# Patient Record
Sex: Female | Born: 1935 | Race: White | Hispanic: No | Smoking: Never smoker
Health system: Southern US, Community
[De-identification: ages and names within clinical notes are randomized; demographics above are authoritative.]

## PROBLEM LIST (undated history)

## (undated) DIAGNOSIS — K269 Duodenal ulcer, unspecified as acute or chronic, without hemorrhage or perforation: Secondary | ICD-10-CM

## (undated) DIAGNOSIS — K259 Gastric ulcer, unspecified as acute or chronic, without hemorrhage or perforation: Secondary | ICD-10-CM

## (undated) DIAGNOSIS — C801 Malignant (primary) neoplasm, unspecified: Secondary | ICD-10-CM

## (undated) DIAGNOSIS — M1990 Unspecified osteoarthritis, unspecified site: Secondary | ICD-10-CM

## (undated) HISTORY — PX: ABDOMINAL HYSTERECTOMY: SHX81

## (undated) HISTORY — PX: JOINT REPLACEMENT: SHX530

---

## 2003-12-29 ENCOUNTER — Emergency Department: Payer: Self-pay | Admitting: Emergency Medicine

## 2004-01-07 ENCOUNTER — Emergency Department: Payer: Self-pay | Admitting: General Practice

## 2004-10-20 ENCOUNTER — Ambulatory Visit: Payer: Self-pay | Admitting: Internal Medicine

## 2005-11-04 ENCOUNTER — Ambulatory Visit: Payer: Self-pay | Admitting: Internal Medicine

## 2006-12-21 ENCOUNTER — Ambulatory Visit: Payer: Self-pay | Admitting: Internal Medicine

## 2010-02-08 HISTORY — PX: CATARACT EXTRACTION, BILATERAL: SHX1313

## 2014-02-28 ENCOUNTER — Ambulatory Visit: Payer: Self-pay | Admitting: Surgery

## 2014-03-11 ENCOUNTER — Ambulatory Visit: Payer: Self-pay | Admitting: Surgery

## 2014-03-11 LAB — URINALYSIS, COMPLETE
BACTERIA: NONE SEEN
Bilirubin,UR: NEGATIVE
GLUCOSE, UR: NEGATIVE mg/dL (ref 0–75)
LEUKOCYTE ESTERASE: NEGATIVE
NITRITE: NEGATIVE
PROTEIN: NEGATIVE
Ph: 5 (ref 4.5–8.0)
RBC,UR: <1 /HPF (ref 0–5)
Specific Gravity: 1.008 (ref 1.003–1.030)
Squamous Epithelial: 1

## 2014-03-11 LAB — BASIC METABOLIC PANEL
ANION GAP: 7 (ref 7–16)
BUN: 15 mg/dL (ref 7–18)
Calcium, Total: 9 mg/dL (ref 8.5–10.1)
Chloride: 103 mmol/L (ref 98–107)
Co2: 29 mmol/L (ref 21–32)
Creatinine: 0.85 mg/dL (ref 0.60–1.30)
EGFR (African American): >60
EGFR (Non-African Amer.): >60
Glucose: 79 mg/dL (ref 65–99)
OSMOLALITY: 277 (ref 275–301)
POTASSIUM: 3.6 mmol/L (ref 3.5–5.1)
SODIUM: 139 mmol/L (ref 136–145)

## 2014-03-11 LAB — CBC
HCT: 37.3 % (ref 35.0–47.0)
HGB: 12.7 g/dL (ref 12.0–16.0)
MCH: 32.8 pg (ref 26.0–34.0)
MCHC: 33.9 g/dL (ref 32.0–36.0)
MCV: 97 fL (ref 80–100)
PLATELETS: 341 10*3/uL (ref 150–440)
RBC: 3.86 10*6/uL (ref 3.80–5.20)
RDW: 13.4 % (ref 11.5–14.5)
WBC: 7.6 10*3/uL (ref 3.6–11.0)

## 2014-03-11 LAB — APTT: Activated PTT: 32.7 secs (ref 23.6–35.9)

## 2014-03-11 LAB — PROTIME-INR
INR: 1
Prothrombin Time: 13 secs

## 2014-03-11 LAB — SEDIMENTATION RATE: ERYTHROCYTE SED RATE: 22 mm/h (ref 0–30)

## 2014-03-11 LAB — MRSA PCR SCREENING

## 2014-03-19 ENCOUNTER — Inpatient Hospital Stay: Payer: Self-pay | Admitting: Surgery

## 2014-06-03 LAB — SURGICAL PATHOLOGY

## 2014-06-09 NOTE — Op Note (Signed)
PATIENT NAME:  Brandy Orr, Brandy Orr MR#:  [id number] DATE OF BIRTH:  June 06, 1932  DATE OF PROCEDURE:  03/19/2014  PREOPERATIVE DIAGNOSIS: Advanced degenerative joint disease right hip.   POSTOPERATIVE DIAGNOSIS: Advanced degenerative joint disease right hip.   PROCEDURE: Right total hip arthroplasty using an all Press-Fit Biomet Echo system with a #13 lateral offset Echo stem, a 54 mm acetabular shell with a hi-wall liner, and a 36 mm head with a + 3 mm neck adapter.   SURGEON:  Pascal Lux, MD.   ASSISTANT: April Berndt, NP.   ANESTHESIA: Spinal.   FINDINGS: As noted above.   COMPLICATIONS: None.   ESTIMATED BLOOD LOSS: 300 mL.   TOTAL FLUIDS: 2000 mL of crystalloid.     URINE OUTPUT: 650 mL.   TOURNIQUET: None.   DRAINS: None.   CLOSURE: Staples.   BRIEF CLINICAL NOTE: The patient is an 81 year old female with a long history of progressively worsening right hip pain. Her symptoms have progressed despite medications, activity modification, et Ronney Asters. Her history and examination were consistent with advanced degenerative joint disease, confirmed by plain radiographs. 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 She presents at this time for a right total hip arthroplasty.   PROCEDURE IN DETAIL: The patient was brought into the operating room. After adequate spinal anesthesia was obtained, she was lain in the supine position and a Foley catheter inserted. She then was rolled into the left lateral decubitus position and secured using the lateral hip positioner. Appropriate padding was assured. The right hip and lower extremity were prepped with ChloraPrep solution before being draped sterilely. Preoperative antibiotics were administered.  A standard posterior approach to the hip was made through an approximately 5-6 inch incision. The incision was carried down through the subcutaneous tissues to expose the fascia overlying the abductor muscles and proximal end of the  iliotibial band. These structures were split the length of the incision and the Charnley hip retractor inserted. The bursal tissues were swept posteriorly to expose the short external rotators. The anterior border of the piriformis tendon was identified and this plane developed down through the capsule down to the joint. A flap of tissue was elevated off the posterior aspect of the greater trochanter and femoral neck and swept posteriorly. This flap included the piriformis tendon, the short external rotators, and the posterior capsule. Soft tissues were elevated off the lateral aspect of the ilium and a large Steinmann pin placed bicortically. With the right leg aligned over the left, a drill bit was placed into the greater trochanter parallel to the Steinmann pin. The distance between these two pins was measured and found to be 5.3 cm. The drill bit was removed from the greater trochanter and the hip was carefully dislocated. The piriformis fossa was debrided of soft tissues before the intramedullary canal was accessed through this point using the triple step reamer. The canal was reamed sequentially beginning with a 7 mm reamer and progressing to a 13 mm reamer. This provided good circumferential chatter. It was removed and the femoral neck cut made approximately 10-12 mm above the lesser trochanter using the extramedullary guide.   Attention was directed to the acetabular side. It was noted that much of the labrum was calcified. This was removed circumferentially including some osteophytes posteriorly and anteriorly. A line was drawn on the drapes corresponding to the native version of the acetabulum and used as a reference for reaming the acetabulum. The fovea was debrided using a large curette.  The acetabulum was reamed sequentially beginning with a 44 mm reamer, progressing to a 53 mm reamer. This provided good circumferential chatter, so the 53 mm trial acetabular insert was positioned and found to fit  well. Therefore, the 54 mm acetabular component was impacted into place with care taken to maintain the appropriate version. The trial high wall liner was inserted.   Attention was redirected to the femoral side. The box osteotome was used to establish version before the canal was broached sequentially beginning with a #9 broach and progressing to a #13 broach. This was left in place and several trial reductions performed using both a standard and lateral offset neck options, as well as the - 6 mm, the 0 mm, and the + 3 mm neck lengths. The lateral offset neck angle with the  + 3 mm neck length provided the best stability both to extension and external rotation, as well as with flexion to 90 degrees and internal rotation beyond 70 degrees. She also was stable in the position of sleep. The leg lengths were measured and found to be 5.6 cm. This slight lengthening was felt to be appropriate, given her preoperative shortening. Therefore, the trial components were removed. The manhole cover was placed at the dome of the acetabulum before the permanent liner was inserted. The locking mechanism was verified using a 1/4 inch osteotome. The femoral stem was impacted into place with care taken to maintain the appropriate version before a repeat trial reduction with a + 3 mm neck option was conducted, with the findings as described above. Therefore, the permanent 36 mm head with a + 3 mm neck was impacted into place. The Morse taper locking mechanism was verified using manual distraction and found to be excellent. The hip was relocated and again placed through a range of motion with the findings as described above.   The wound was copiously irrigated with bacitracin saline solution via the jet lavage system. In addition, the anterior, inferior, and posterior capsular tissues, as well as the peri-incisional tissues all were injected with 30 mL of 0.25% Sensorcaine with epinephrine as well as with 20 mL of Exparel diluted  out to 60 mL using normal saline. This was done to help with postoperative analgesia. The posterior flap was reapproximated through bone tunnels to the posterior aspect of the greater trochanter using #2 Tycron interrupted sutures. Several additional sutures were used to reinforce this layer of closure. The iliotibial band was reapproximated using 0 Vicryl interrupted sutures before the gluteal fascia was closed with a running number 0 Vicryl suture. The subcutaneous tissues were closed in two layers using 2-0 Vicryl interrupted sutures before the skin was closed using staples. A sterile occlusive dressing was applied to the wound before the patient was placed into an abduction wedge pillow. She was then rolled back into supine position on her hospital bed before she was awakened and returned to the recovery room in satisfactory condition after tolerating the procedure well. Of note, 1 gram of tranexamic acid in 10 mL of normal saline was injected intra-articularly after closure of the gluteal fascial layer.    ____________________________ Lenna Sciara. Dorien Chihuahua, MD jjp:bu D: 03/19/2014 14:29:35 ET T: 03/19/2014 16:23:25 ET JOB#: [id number]  cc: Pascal Lux, MD, <Dictator> Pascal Lux MD ELECTRONICALLY SIGNED 03/21/2014 11:33

## 2014-06-09 NOTE — Consult Note (Signed)
Pt hgb stable at 8.9, VSS afebrile, denies nausea or abd pain. She will not tolerate oral iron with her very large ulcer and may be a candidate for iv iron infusion at the [facility] in the next few weeks,  would also check a CBC in 2 weeks.  I will sign off.  Electronic Signatures: Manya Silvas (MD)  (Signed on 16-Feb-16 10:39)  Authored  Last Updated: 16-Feb-16 10:39 by Manya Silvas (MD)

## 2014-06-09 NOTE — Discharge Summary (Signed)
PATIENT NAME:  Brandy Orr, Brandy Orr MR#:  [id number] DATE OF BIRTH:  1932-12-04  DATE OF ADMISSION:  03/19/2014 DATE OF DISCHARGE:  03/22/2014  ADMITTING DIAGNOSIS:  Advanced degenerative arthritis of the right hip.   DISCHARGE DIAGNOSIS:  Advanced degenerative arthritis of the right hip.  OPERATION:  On 03/19/2014, the patient had a right total hip arthroplasty.   SURGEON:  Pascal Lux, MD  ASSISTANT:  April Berndt, NP   ANESTHESIA:  Spinal.   ESTIMATED BLOOD LOSS:  300 mL.   IMPLANTS USED:  Press-fit Biomet Echo system with #13 lateral offset Echo stem, 54 mm acetabular shell with high wall liner, 36 mm head and a +3 mm neck adapter. The patient was stabilized, brought to the recovery room, and then brought down to the orthopedic floor.   HISTORY AND PHYSICAL HISTORY:  The patient is a 81 year old female who presented for progressive right hip pain. The patient has been refractory to conservative treatment. The patient has done activities of daily living with difficulty and has been trying to modify this. The patient has tried injections of steroid and pain medication with no relief.   PHYSICAL EXAMINATION: GENERAL:  Well-developed, well-nourished elderly female ambulating with varus thrust to the right hip.  RESPIRATORY:  Lungs are clear to auscultation.  CARDIOVASCULAR:  Regular rate and rhythm with no murmur.  MUSCULOSKELETAL:  In regard to the right hip, the patient has discomfort at the anterior hip joint with pain with internal and external rotation. The patient has flexion to 95 degrees, internal rotation that is neutral, and external rotation of 10 degrees. The patient has neurovascular essentially that is intact.   HOSPITAL COURSE:  After initial admission on 03/19/2014, the patient was brought to the orthopedic floor. On postoperative day 1, the patient had a hemoglobin of 9.6, which dropped to 8.8 on postoperative day 2 and down to 8.4 on postoperative day 3. The patient  was doing well, initially ambulated bed to chair and progressed to ambulating 300 feet. The patient did have nausea and diarrhea on postoperative day 2, progressing into postoperative day 3 before discharge, which was controlled with medication.   CONDITION AT DISCHARGE:  Stable.   DISPOSITION:  The patient was sent to rehabilitation.   DISCHARGE INSTRUCTIONS:  The patient will follow up at [facility] in 2 weeks. The patient will do weight bear as tolerated on the affected leg. The patient is not to cross her legs. She will not reach below her knees and not twist. The patient will use knee-high TED hose on both legs to be removed 1 hour every 8-hour shift. The patient will use an incentive spirometer and be encouraged to do cough and deep breathing. The patient will do a regular diet. The patient will not get her dressing wet and try to keep it clean. The patient will call the clinic if there is any bright red bleeding, calf pain, any bowel or bladder difficulty, or any fever greater than 101.5. The patient will do physical therapy and occupational therapy per protocol.   DISCHARGE MEDICATIONS:  Zanaflex 2 mg 2 times a day for spasms, tramadol 50 mg 1 tablet every 8 hours as needed for pain, Cosamin DS 400/500 mg 1 capsule daily, Tylenol 500 mg 1 tablet q. 4 hours, oxycodone 5 mg 1 tablet q. 4 hours, Milk of Magnesia 30 mL 2 times a day, aluminum hydroxide 30 mL q. 6 hours as needed, hydroxide simethicone for indigestion, Lovenox 40 mg injectable once a  day for 14 days and then discontinue and start aspirin 81 mg once a day, bisacodyl 10 mg rectally p.r.n. for constipation, Senokot-S 1 tablet b.i.d.   ____________________________ J. Reche Dixon, [place] jtm:nb D: 03/22/2014 06:39:18 ET T: 03/22/2014 06:48:55 ET JOB#: [id number]  cc: J. Reche Dixon, [place], <Dictator> J [facility]son [facility] ELECTRONICALLY SIGNED 03/28/2014 15:50

## 2014-06-09 NOTE — Consult Note (Signed)
Pt with recent Right hip replacement who had hematemesis and dropped hgb over last 24 hours.  The first vomitus looked like food she had eaten and the second looked like blood and the third was thin liquid.  She was on low dose lovenox which has been stopped.  Due to the need for anticoagulation after the hip replacement I will go ahead with EGD tomorrow to see if dealing with Mack Guise tear or ulcer or gastritis.  Will give Zosyn for prevention of possible blood bourne infection to new hip.  Electronic Signatures: Manya Silvas (MD)  (Signed on 12-Feb-16 17:00)  Authored  Last Updated: 12-Feb-16 17:00 by Manya Silvas (MD)

## 2014-06-09 NOTE — Consult Note (Signed)
Pt hgb 9, VSS afebrile, no vomiting, no abd pain, "I feel better". Pt diet advanced to full liquids.  She should stay at this diet for 3 more days with Ensure or Boost supplements also.  She needs to stay on Prilosec or equivalant medication forever, for bid therapy for 3 months then daily.  May be discharged to rehab tomorrow.  Electronic Signatures: Manya Silvas (MD)  (Signed on 15-Feb-16 17:35)  Authored  Last Updated: 15-Feb-16 17:35 by Manya Silvas (MD)

## 2014-06-09 NOTE — Op Note (Signed)
PATIENT NAME:  Brandy Orr, Brandy Orr MR#:  [id number] DATE OF BIRTH:  1932/09/24  DATE OF PROCEDURE:  03/23/2014  PREOPERATIVE DIAGNOSES:  1.  Massive duodenal ulcer.  2.  Postoperative day 4, status post right hip replacement.  3.  High risk for deep vein thrombosis and therefore pulmonary embolism.  POSTOPERATIVE DIAGNOSIS:    1.  Massive duodenal ulcer.  2.  Postoperative day 4, status post right hip replacement.  3.  High risk for deep vein thrombosis and therefore pulmonary embolism.  PROCEDURE PERFORMED:  1.  Inferior vena cavogram.  2.  Placement of infrarenal inferior vena cava filter, Cook-Celect type.   SURGEON: Katha Cabal, MD   SEDATION:  Versed 3 mg.   ACCESS: Right common femoral vein.   FLUOROSCOPY TIME: 0.7 minutes.   CONTRAST USED: Isovue 15 mL.   INDICATIONS: Ms. Corona is an 81 year old woman who is 4 days status post hip replacement and now found to have a massive duodenal ulcer. She is felt to be at high risk for hemorrhage and therefore is not a candidate for anticoagulation, even for prophylaxis.  I have therefore been requested to place a filter to prevent lethal pulmonary embolism in a high risk patient.   DESCRIPTION OF PROCEDURE: The patient is taken to special procedures and placed in the supine position.  After adequate sedation is achieved, she is positioned supine and the right groin is prepped and draped in a sterile fashion. Ultrasound is placed in a sterile sleeve.  The femoral vein is noted.  It is rather small but compressible, indicating patency. Images recorded for the permanent record. 1% lidocaine is infiltrated and a micropuncture needle is inserted, microwire followed by micro sheath, J-wire followed by the delivery sheath.  A bolus injection of contrast is used to demonstrate the inferior vena cava.  After review of the images, the wire is reintroduced and the filter is advanced to the level of L2.  The filter is then deployed without  difficulty. The sheath is pulled, pressure held. There are no immediate complications.   INTERPRETATION: The inferior vena cava is opacified with a bolus injection of contrast. It is somewhat large at the level of the renals; however, measurement demonstrates that at the level of the renals, it is approximately 30 mm at the level of L2.  The vena cava is measured and noted to be 24 mm adequate for placement of a filter, therefore the Celect filter is deployed at the level of L2 in good orientation.   SUMMARY: Successful placement of infrarenal inferior vena cava filter.     ____________________________ Katha Cabal, MD ggs:DT D: 03/23/2014 15:32:39 ET T: 03/23/2014 16:44:56 ET JOB#: [id number]  cc: Katha Cabal, MD, <Dictator> Katha Cabal MD ELECTRONICALLY SIGNED 04/16/2014 14:24

## 2014-06-09 NOTE — Consult Note (Signed)
 General Aspect massive duodenal ulcer in a patient pod 4 from right hip   Present Illness The patient is an 82-year-old who came to [facility] on 03/19/2014 for a  right total hip arthroplasty posterior approach. On postoperative day 1, she was noted to have a hemoglobin of 9.6. Follow up hemoglobin was 8.9. She was also hypotensive with blood pressure in the 80s over 40s, her hemoglobin drifted to 8.4 yesterday. She had dry heaves following coffee-ground emesis. No stomach issues prior to this admission. Today she had an EGD which shows a massive duodenal ulcer.  Given her history of declining Hgb and her huge ulcer it is felt she can not have any anticoagulation which is indicated given her high risk for DVT adn PE following hip surgery.  PAST MEDICAL HISTORY: Arthritis.   PAST SURGICAL HISTORY:   1. Hysterectomy.  2. Tonsillectomy.  3. Cataract surgery.   Home Medications: Medication Instructions Status  oxyCODONE 5 mg oral tablet 1 tab(s) orally every 4 hours, As Needed, severe pain (7-10/10) Active  traMADol 50 mg oral tablet 1 tab(s) orally every 8 hours, As Needed - for Pain Active  docusate-senna 50 mg-8.6 mg oral tablet 1 tab(s) orally 2 times a day prn for constipation Active  Lovenox 40 mg/0.4 mL injectable solution  injectable once a day x 14 days, then discontinue and begin ASA 81 mg once a day. Active  bisacodyl 10 mg rectal suppository 1 suppository(ies) rectal once a day, As needed, constipation if no results with MOM Active  acetaminophen 500 mg oral tablet 1 tab(s) orally every 4 hours, As needed, mild pain (1-3/10) or fever Active  magnesium hydroxide 8% oral suspension 30 milliliter(s) orally 2 times a day, As needed, constipation Active  aluminum hydroxide/magnesium hydroxide/simethicone 400 mg-400 mg-40 mg/5 mL oral suspension 30 milliliter(s) orally every 6 hours, As needed, indigestion, heartburn Active  Cosamin DS 400 mg-500 mg oral capsule 1 cap(s) orally once a day (at  bedtime) Active  tiZANidine 2 mg oral capsule 2 cap(s) orally 2 times a day, As Needed for muscle spams. Active    Hydrocodone: N/V/Diarrhea  Case History:  Family History Non-Contributory   Social History negative tobacco, negative ETOH, negative Illicit drugs   Review of Systems:  ROS No TIA/stroke/seizure No heat or cold intolerance No dysuria/hematuria No blurry or double vision No tinnitus or ear pain No rashes or ulcer No suicidal ideation or psychosis No signs of bleeding or easy bruising No SOB/DOE, orthopnea, or sputum No palpitations or chest pain No N/V/D or abdominal pain No joint pain or joint swelling No fever or chills No unintentional weight loss or gain   Physical Exam:  GEN well developed, well nourished, no acute distress   HEENT hearing intact to voice, moist oral mucosa   NECK supple  trachea midline   RESP normal resp effort  no use of accessory muscles   CARD regular rate  no JVD   ABD denies tenderness  soft   EXTR negative cyanosis/clubbing, negative edema   SKIN No rashes, No ulcers, skin turgor decreased   NEURO cranial nerves intact, follows commands   PSYCH alert, A+O to time, place, person   Nursing/Ancillary Notes: **Vital Signs.:   13-Feb-16 09:58  Vital Signs Type Post-Procedure  Temperature Temperature (F) 99.1  Celsius 37.2  Temperature Source oral  Pulse Pulse 111  Respirations Respirations 18  Systolic BP Systolic BP 121  Diastolic BP (mmHg) Diastolic BP (mmHg) 63  Mean BP 82  Pulse   Ox % Pulse Ox % 97  Pulse Ox Activity Level  At rest  Oxygen Delivery Room Air/ 21 %   Routine Micro:  12-Feb-16 10:42   Micro Text Report CLOSTRIDIUM DIFFICILE   C.DIFFICILE ANTIGEN       C.DIFFICILE GDH ANTIGEN : NEGATIVE   C.DIFFICILE TOXIN A/B     C.DIFFICILE TOXINS A AND B : NEGATIVE   INTERPRETATION            Negative for C. difficile.    ANTIBIOTIC                        Routine Chem:  12-Feb-16 04:13   Glucose, Serum   100  BUN  27  Creatinine (comp) 0.85  Sodium, Serum 138  Potassium, Serum 3.5  Chloride, Serum 104  CO2, Serum 29  Calcium (Total), Serum  8.2  Anion Gap  5  Osmolality (calc) 281  eGFR (African American) >60  eGFR (Non-African American) >60 (eGFR values <60mL/min/1.73 m2 may be an indication of chronic kidney disease (CKD). Calculated eGFR, using the MRDR Study equation, is useful in  patients with stable renal function. The eGFR calculation will not be reliable in acutely ill patients when serum creatinine is changing rapidly. It is not useful in patients on dialysis. The eGFR calculation may not be applicable to patients at the low and high extremes of body sizes, pregnant women, and vegetarians.)  Routine Hem:  12-Feb-16 04:13   WBC (CBC) 9.7  RBC (CBC)  2.61  Hemoglobin (CBC)  8.4  Hematocrit (CBC)  25.4  Platelet Count (CBC) 268  MCV 97  MCH 32.4  MCHC 33.3  RDW 13.2  Neutrophil % 79.1  Lymphocyte % 8.3  Monocyte % 10.7  Eosinophil % 1.4  Basophil % 0.5  Neutrophil #  7.6  Lymphocyte #  0.8  Monocyte #  1.0  Eosinophil # 0.1  Basophil # 0.0 (Result(s) reported on 22 Mar 2014 at 06:07AM.)    Impression 1.  Massive Duodenal ulcer with declining Hgb  given these findings I have recommended IVC filter as she is not a candidate for routine DVT prophylaxis           risks and benefits were discussed and she agrees to proceed 2.  PUD GI on consult  continue PPI 3.  Right hip plan per ortho 4.  DJD  will need to hold NSAID's   Plan level 3 consult   Electronic Signatures: Schnier, Gregory (MD)  (Signed 13-Feb-16 21:10)  Authored: General Aspect/Present Illness, Home Medications, Allergies, History and Physical Exam, Vital Signs, Labs, Impression/Plan   Last Updated: 13-Feb-16 21:10 by Schnier, Gregory (MD) 

## 2014-06-09 NOTE — Discharge Summary (Signed)
PATIENT NAME:  Brandy Orr, Brandy Orr MR#:  [id number] DATE OF BIRTH:  [birth year]  DATE OF ADMISSION:  03/19/2014 DATE OF DISCHARGE:  03/26/2014  ADMITTING DIAGNOSIS: Degenerative arthritis, right hip.  DISCHARGE DIAGNOSES: Degenerative arthritis, right hip.  SURGERY: On 03/19/2014, she had a right total hip arthroplasty, posterior approach.   SURGEON: Elenore Rota, MD  ANESTHESIA: Spinal.   ESTIMATED BLOOD LOSS: 300 mL.  DRAINS: None.  IMPLANTS: Biomet Echo system, #13 lateral offset stem, 54 mm acetabular shell with high wall liner and 36 mm head with +3 mm neck.   COMPLICATIONS: None during surgery. Posteroperative period complicated by GI bleed from duodenal ulcer.  HOSPITAL COURSE: After initial admission on 03/19/2014, she underwent surgery the same day. She had good pain control afterwards and was transported to the orthopedic floor from PACU. On postoperative day one, 03/20/2014, hemoglobin 9.6, hematocrit 29.1. On room air. Physical therapy was begun on that day, with slow progress. She had some dizziness, and hemoglobin was repeated. It was 8.9 and hematocrit 27.3. She was also hypotensive, 80s/40s, and this was thought to be from her pain medicine. She was given a bolus of 500 mL IV fluids. On postoperative day two, 03/21/2014, normotensive. Hemoglobin 8.8. Hematocrit 26.6. She continued with physical therapy, with slow progress. On postoperative day 3, she  had a bowel movement, but began vomiting coffee ground emesis. GI was consulted. On POD #4, upper GI endoscopy performed, 3cm duodenal ulcer present, not actively bleeding. Protonix started. Lovenox stopped. Vascular surgery consulted. IVC filter placed due to high risk of DVT after total hip arthoplasty. On POD #5, stable, progressing slow with PT, hemoglobin 7.0. One unit PRBC transfused, repeat hemoglobin 8.9 after transfusion. POD #6, hemoglobin 9.0. POD #7, hemoglobin 8.9, cleared by GI for discharge, stable.    CONDITION AT  DISCHARGE: Stable.   DISPOSITION: The patient was sent to rehab.   DISCHARGE INSTRUCTIONS: Weight-bearing as tolerated, left lower extremity. Posterior hip precautions. She will continue physical therapy at rehab. Liquid diet with supplements x 2 days, then mechanical soft x 2 days, then regular diet as tolerated.  Knee-high TED hose bilaterally. She will follow up in [place] in 2 weeks for staple removal.   DISCHARGE MEDICATIONS: Please see discharge instructions for complete list of discharge medications.   ____________________________ Bueford Arp M. Tretha Sciara, NP amb:sb D: 03/22/2014 06:51:49 ET T: 03/22/2014 07:08:48 ET JOB#: [id number]  cc: Marvelous Bouwens M. Tretha Sciara, NP, <Dictator> Kem Kays Ewart Carrera FNP ELECTRONICALLY SIGNED 03/26/2014 10:44

## 2014-06-09 NOTE — Consult Note (Signed)
Pt with a very large ulcer of the duodenal bulb covering 60-75% of the bulb.  No active bleeding, no visible vessel, the passage into the distal duodenum is open.  She will need to be balanced between anticoagulation for prevention of thromboembolic events and bleed from this ulcer.  Vance Peper PA in ortho notified of findings.  Will recommend clear liq diet and advance to full liquids with Boost or Ensure supplement and hematology consult.  Electronic Signatures: Manya Silvas (MD)  (Signed on 13-Feb-16 09:32)  Authored  Last Updated: 13-Feb-16 09:32 by Manya Silvas (MD)

## 2014-06-09 NOTE — Consult Note (Signed)
PATIENT NAME:  Brandy Orr, Brandy Orr MR#:  [id number] DATE OF BIRTH:  03-04-1932  DATE OF CONSULTATION:  03/22/2014  REFERRING PHYSICIAN:  Pascal Lux, MD  CONSULTING PHYSICIAN:  Janalyn Harder. Jerelene Redden, ANP (Adult Nurse Practitioner)  REASON FOR CONSULTATION: Coffee-ground emesis.   HISTORY OF PRESENT ILLNESS: This 81 year old patient reports a good health with exception of arthritis. She has been taking Aleve 2 a day for an undetermined amount of time for chronic hip pain. The patient did come into the hospital on 03/19/2014 for a  right total hip arthroplasty posterior approach. Her estimated blood loss was 300 mL. She had good pain control afterward, and on postoperative day 1, she was noted to have a hemoglobin of 9.6. Physical therapy was started with slow progress, and she had some dizziness, with repeat hemoglobin checked at 8.9. She was also hypotensive with blood pressure in the 80s over 40s, initially thought secondary from pain medication. She did receive one 500 mL bolus of fluid. Yesterday, she was normotensive, hemoglobin was 8.8 and had drifted to 8.4 today. She did have a suppository yesterday afternoon and passed a normal bowel movement. She then had loose stools and was given Senokot last evening and developed vomiting 3 times, with diarrhea during the night. She had passed some watery greenish stool. There was no abdominal pain. She had dry heaves following coffee-ground emesis. No stomach issues prior to this admission. She is currently chewing on ice chips without distress. She denies history of EGD, prior colonoscopy, or problems with heartburn or indigestion. GI has been asked to see her for further evaluation and management. The patient was slated to be released tomorrow to go to [facility] for rehabilitation.   PAST MEDICAL HISTORY: Arthritis.   PAST SURGICAL HISTORY:   1. Hysterectomy.  2. Tonsillectomy.  3. Cataract surgery.   MEDICATIONS AT HOME: 1. Tramadol.  2.  Flexeril.  3. Aleve 2 tablets daily.  4. Aspirin for 6 years.   ALLERGIES: HYDROCODONE.   SOCIAL HISTORY: Habits: Negative tobacco or alcohol.   FAMILY HISTORY: Negative for colon cancer.   REVIEW OF SYSTEMS: Ten systems reviewed. Pertinent positives noted in the history of present illness, otherwise negative. Diarrhea has resolved with negative Clostridium difficile.   LABORATORY DATA: Daughter reports her baseline hemoglobin is in the 12 range. The patient's initial day 1 postoperative hemoglobin 9.6, currently 8.4, with BUN up at 27, with creatinine 0.85.   IMPRESSION:  1. The patient presents for a right hip replacement and developed gradually worsening anemia with coffee-ground emesis. This is likely an upper gastrointestinal bleed, could be secondary to gastritis, but we do need to make sure she does not have an ulcer that needs to be clipped. Therefore, an upper endoscopy is planned for tomorrow morning. She will need to have a pillow between her legs when she turns to her side to take care not to cross her legs over one another. She will also need to have IV Zosyn 3.375 grams in the endoscopy suite before the upper endoscopy for the fresh hip replacement.  2. Repeat the hemoglobin in the morning.  3. Continue with IV Protonix twice daily.  4. She is n.p.o. at this time and will need to be n.p.o. after midnight.  5. She will likely need to be in the hospital for another day or 2 to monitor.  6. This case was discussed with Dr. Vira Agar in collaboration of care.  7. Follow up her study results.   Thank you  for this consultation.   These services provided by Royetta Crochet. Jerelene Redden, MS, APRN, [facility], APN under collaborative agreement with Manya Silvas, MD    ____________________________ Janalyn Harder Jerelene Redden, ANP (Adult Nurse Practitioner) kam:mw D: 03/22/2014 16:43:09 ET T: 03/22/2014 17:14:29 ET JOB#: [id number]  cc: Joelene Millin A. Jerelene Redden, ANP (Adult Nurse Practitioner), <Dictator> Janalyn Harder Sherlyn Hay, MSN, ANP-BC Adult Nurse Practitioner ELECTRONICALLY SIGNED 03/26/2014 13:26

## 2014-07-12 ENCOUNTER — Ambulatory Visit: Payer: Medicare Other | Admitting: Anesthesiology

## 2014-07-12 ENCOUNTER — Ambulatory Visit
Admission: RE | Admit: 2014-07-12 | Discharge: 2014-07-12 | Disposition: A | Discharge status: Home / Self Care | Payer: Medicare Other | Source: Ambulatory Visit | Attending: Unknown Physician Specialty | Admitting: Unknown Physician Specialty

## 2014-07-12 ENCOUNTER — Encounter: Payer: Self-pay | Admitting: *Deleted

## 2014-07-12 ENCOUNTER — Encounter
Admission: RE | Disposition: A | Discharge status: Home / Self Care | Payer: Self-pay | Source: Ambulatory Visit | Attending: Unknown Physician Specialty

## 2014-07-12 DIAGNOSIS — K2970 Gastritis, unspecified, without bleeding: Secondary | ICD-10-CM | POA: Diagnosis not present

## 2014-07-12 DIAGNOSIS — K2950 Unspecified chronic gastritis without bleeding: Secondary | ICD-10-CM | POA: Insufficient documentation

## 2014-07-12 DIAGNOSIS — Z79899 Other long term (current) drug therapy: Secondary | ICD-10-CM | POA: Diagnosis not present

## 2014-07-12 DIAGNOSIS — K259 Gastric ulcer, unspecified as acute or chronic, without hemorrhage or perforation: Secondary | ICD-10-CM | POA: Diagnosis present

## 2014-07-12 DIAGNOSIS — M1990 Unspecified osteoarthritis, unspecified site: Secondary | ICD-10-CM | POA: Diagnosis not present

## 2014-07-12 DIAGNOSIS — K264 Chronic or unspecified duodenal ulcer with hemorrhage: Secondary | ICD-10-CM | POA: Diagnosis not present

## 2014-07-12 DIAGNOSIS — K210 Gastro-esophageal reflux disease with esophagitis: Secondary | ICD-10-CM | POA: Diagnosis not present

## 2014-07-12 HISTORY — PX: ESOPHAGOGASTRODUODENOSCOPY (EGD) WITH PROPOFOL: SHX5813

## 2014-07-12 HISTORY — DX: Unspecified osteoarthritis, unspecified site: M19.90

## 2014-07-12 SURGERY — ESOPHAGOGASTRODUODENOSCOPY (EGD) WITH PROPOFOL
Anesthesia: General

## 2014-07-12 MED ORDER — FENTANYL CITRATE (PF) 100 MCG/2ML IJ SOLN
INTRAMUSCULAR | Status: DC | PRN
  Administered 2014-07-12: 50 ug via INTRAVENOUS

## 2014-07-12 MED ORDER — MIDAZOLAM HCL 2 MG/2ML IJ SOLN
INTRAMUSCULAR | Status: DC | PRN
  Administered 2014-07-12: 1 mg via INTRAVENOUS

## 2014-07-12 MED ORDER — SODIUM CHLORIDE 0.9 % IV SOLN
INTRAVENOUS | Status: DC
  Administered 2014-07-12: 1000 mL via INTRAVENOUS

## 2014-07-12 MED ORDER — PIPERACILLIN-TAZOBACTAM 3.375 G IVPB 30 MIN
3.3750 g | Freq: Once | INTRAVENOUS | Status: AC
  Administered 2014-07-12: 3.375 g via INTRAVENOUS
  Filled 2014-07-12: qty 50

## 2014-07-12 MED ORDER — PROPOFOL 10 MG/ML IV BOLUS
INTRAVENOUS | Status: DC | PRN
  Administered 2014-07-12: 40 mg via INTRAVENOUS
  Administered 2014-07-12: 25 mg via INTRAVENOUS

## 2014-07-12 MED ORDER — LIDOCAINE HCL (CARDIAC) 20 MG/ML IV SOLN
INTRAVENOUS | Status: DC | PRN
  Administered 2014-07-12: 100 mg via INTRAVENOUS

## 2014-07-12 MED ORDER — GLYCOPYRROLATE 0.2 MG/ML IJ SOLN
INTRAMUSCULAR | Status: DC | PRN
  Administered 2014-07-12: 0.2 mg via INTRAVENOUS

## 2014-07-12 NOTE — Anesthesia Postprocedure Evaluation (Signed)
  Anesthesia Post-op Note  Patient: Brandy Orr  Procedure(s) Performed: Procedure(s): ESOPHAGOGASTRODUODENOSCOPY (EGD) WITH PROPOFOL (N/A)  Anesthesia type:General  Patient location: PACU  Post pain: Pain level controlled  Post assessment: Post-op Vital signs reviewed, Patient's Cardiovascular Status Stable, Respiratory Function Stable, Patent Airway and No signs of Nausea or vomiting  Post vital signs: Reviewed and stable  Last Vitals:  Filed Vitals:   07/12/14 1406  BP: 108/62  Pulse: 96  Temp:   Resp: 15    Level of consciousness: awake, alert  and patient cooperative  Complications: No apparent anesthesia complications

## 2014-07-12 NOTE — Anesthesia Preprocedure Evaluation (Addendum)
Anesthesia Evaluation  Patient identified by MRN, date of birth, ID band Patient awake    Reviewed: Allergy & Precautions, NPO status   History of Anesthesia Complications Negative for: history of anesthetic complications  Airway Mallampati: II       Dental  (+) Lower Dentures, Upper Dentures   Pulmonary neg pulmonary ROS,    Pulmonary exam normal       Cardiovascular negative cardio ROS  Rhythm:Regular Rate:Normal     Neuro/Psych Anxiety negative neurological ROS     GI/Hepatic negative GI ROS, Neg liver ROS,   Endo/Other  negative endocrine ROS  Renal/GU negative Renal ROS  negative genitourinary   Musculoskeletal  (+) Arthritis -, Osteoarthritis,    Abdominal Normal abdominal exam  (+)   Peds negative pediatric ROS (+)  Hematology negative hematology ROS (+)   Anesthesia Other Findings   Reproductive/Obstetrics negative OB ROS                             Anesthesia Physical Anesthesia Plan  ASA: II  Anesthesia Plan: General   Post-op Pain Management:    Induction: Intravenous  Airway Management Planned: Nasal Cannula  Additional Equipment:   Intra-op Plan:   Post-operative Plan:   Informed Consent:   Plan Discussed with: CRNA  Anesthesia Plan Comments:         Anesthesia Quick Evaluation

## 2014-07-12 NOTE — Op Note (Signed)
[facility] Patient Name: Brandy Orr Procedure Date: 07/12/2014 1:35 PM MRN: [id number] Account #: [id number] Date of Birth: 19-Sep-1932 Admit Type: Outpatient Age: 81 Room: [facility] ENDO ROOM 4 Gender: Female Note Status: Finalized Procedure:         Upper GI endoscopy Indications:       Follow-up of acute duodenal ulcer with hemorrhage Providers:         Manya Silvas, MD Referring MD:      Glendon Axe (Referring MD) Medicines:         Propofol per Anesthesia Complications:     No immediate complications. Procedure:         Pre-Anesthesia Assessment:                    - After reviewing the risks and benefits, the patient was                     deemed in satisfactory condition to undergo the procedure.                    After obtaining informed consent, the endoscope was passed                     under direct vision. Throughout the procedure, the                     patient's blood pressure, pulse, and oxygen saturations                     were monitored continuously. The Endoscope was introduced                     through the mouth, and advanced to the second part of                     duodenum. The upper GI endoscopy was accomplished without                     difficulty. The patient tolerated the procedure well. Findings:      LA Grade A (one or more mucosal breaks less than 5 mm, not extending       between tops of 2 mucosal folds) esophagitis with no bleeding was found       39 cm from the incisors.      Diffuse and localized mild inflammation characterized by erythema and       granularity was found in the gastric body. Biopsies were taken with a       cold forceps for histology. Biopsies were taken with a cold forceps for       Helicobacter pylori testing.      One non-bleeding cratered gastric ulcer with no stigmata of bleeding was       found at the pylorus. The lesion was 3 mm in largest dimension. Biopsies       were  taken with a cold forceps for histology. Biopsies were taken with a       cold forceps for Helicobacter pylori testing. Bx taken of surrounding       inflammation.      The examined duodenum was normal. Impression:        - LA Grade A reflux esophagitis.                    - Gastritis.  Biopsied.                    - Gastric ulcer with clean base. Biopsied.                    - Normal examined duodenum. Recommendation:    - Await pathology results. Continue stomach medicine for                     ever. Manya Silvas, MD 07/12/2014 1:53:32 PM This report has been signed electronically. Number of Addenda: 0 Note Initiated On: 07/12/2014 1:35 PM      [facility]

## 2014-07-12 NOTE — Transfer of Care (Signed)
Immediate Anesthesia Transfer of Care Note  Patient: Brandy Orr  Procedure(s) Performed: Procedure(s): ESOPHAGOGASTRODUODENOSCOPY (EGD) WITH PROPOFOL (N/A)  Patient Location: PACU  Anesthesia Type:General  Level of Consciousness: awake and alert   Airway & Oxygen Therapy: Patient Spontanous Breathing  Post-op Assessment: Report given to RN  Post vital signs: Reviewed and stable  Last Vitals:  Filed Vitals:   07/12/14 1300  BP: 128/69  Pulse: 77  Temp: 35.8 C  Resp: 20    Complications: No apparent anesthesia complications

## 2014-07-12 NOTE — H&P (Signed)
   Gaylyn Cheers, MD    Primary Care Physician:  Glendon Axe, MD Primary Gastroenterologist:  Dr. Vira Agar  Pre-Procedure History & Physical: HPI:  Brandy Orr is a 81 y.o. female is here for an endoscopy. 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   Past Medical History  Diagnosis Date  . Arthritis     Past Surgical History  Procedure Laterality Date  . Joint replacement    . Abdominal hysterectomy      Prior to Admission medications   Medication Sig Start Date End Date Taking? Authorizing Provider  acetaminophen (TYLENOL) 650 MG CR tablet Take 1,300 mg by mouth every 8 (eight) hours as needed for pain.   Yes Historical Provider, MD  COSAMIN DS 500-400 MG CAPS Take 1 tablet by mouth 2 (two) times daily. 04/12/14  Yes Historical Provider, MD  docusate sodium (COLACE) 100 MG capsule Take 100 mg by mouth daily as needed for mild constipation.   Yes Historical Provider, MD  pantoprazole (PROTONIX) 40 MG tablet Take 1 tablet by mouth daily. 06/11/14  Yes Historical Provider, MD    Allergies as of 06/14/2014  . (Not on File)    History reviewed. No pertinent family history.    Review of Systems: See HPI, otherwise negative ROS  Physical Exam: BP 128/69 mmHg  Pulse 77  Temp(Src) 96.5 F (35.8 C) (Tympanic)  Resp 20  Ht 5\' 7"  (1.702 m)  Wt 68.04 kg (150 lb)  BMI 23.49 kg/m2 General:   Alert,  pleasant and cooperative in NAD Head:  Normocephalic and atraumatic. Neck:  Supple; no masses or thyromegaly. Lungs:  Clear throughout to auscultation.    Heart:  Regular rate and rhythm. Abdomen:  Soft, nontender and nondistended. Normal bowel sounds, without guarding, and without rebound.   Neurologic:  Alert and  oriented x4;  grossly normal neurologically.  Impression/Plan: Brandy Orr is here for an endoscopy to be performed for follow up of giant duodenal ulcer.  Risks, benefits, limitations, and alternatives regarding  endoscopy have  been reviewed with the patient.  Questions have been answered.  All parties agreeable.   Gaylyn Cheers, MD  07/12/2014, 1:34 PM

## 2014-07-16 LAB — SURGICAL PATHOLOGY

## 2014-07-17 ENCOUNTER — Encounter: Payer: Self-pay | Admitting: Unknown Physician Specialty

## 2014-07-24 ENCOUNTER — Encounter: Payer: Self-pay | Admitting: *Deleted

## 2014-07-24 ENCOUNTER — Ambulatory Visit
Admission: RE | Admit: 2014-07-24 | Discharge: 2014-07-24 | Disposition: A | Discharge status: Home / Self Care | Payer: Medicare Other | Source: Ambulatory Visit | Attending: Vascular Surgery | Admitting: Vascular Surgery

## 2014-07-24 ENCOUNTER — Encounter
Admission: RE | Disposition: A | Discharge status: Home / Self Care | Payer: Self-pay | Source: Ambulatory Visit | Attending: Vascular Surgery

## 2014-07-24 DIAGNOSIS — E785 Hyperlipidemia, unspecified: Secondary | ICD-10-CM | POA: Insufficient documentation

## 2014-07-24 DIAGNOSIS — M1610 Unilateral primary osteoarthritis, unspecified hip: Secondary | ICD-10-CM | POA: Diagnosis not present

## 2014-07-24 DIAGNOSIS — Z86718 Personal history of other venous thrombosis and embolism: Secondary | ICD-10-CM | POA: Diagnosis not present

## 2014-07-24 HISTORY — PX: PERIPHERAL VASCULAR CATHETERIZATION: SHX172C

## 2014-07-24 SURGERY — IVC FILTER REMOVAL
Anesthesia: Moderate Sedation

## 2014-07-24 MED ORDER — IOHEXOL 300 MG/ML  SOLN
INTRAMUSCULAR | Status: DC | PRN
  Administered 2014-07-24: 15 mL via INTRAVENOUS

## 2014-07-24 MED ORDER — FENTANYL CITRATE (PF) 100 MCG/2ML IJ SOLN
INTRAMUSCULAR | Status: DC | PRN
  Administered 2014-07-24 (×3): 50 ug via INTRAVENOUS

## 2014-07-24 MED ORDER — MIDAZOLAM HCL 2 MG/2ML IJ SOLN
INTRAMUSCULAR | Status: AC
  Filled 2014-07-24: qty 4

## 2014-07-24 MED ORDER — SODIUM CHLORIDE 0.9 % IV SOLN
INTRAVENOUS | Status: DC
  Administered 2014-07-24: 14:00:00 via INTRAVENOUS

## 2014-07-24 MED ORDER — HEPARIN (PORCINE) IN NACL 2-0.9 UNIT/ML-% IJ SOLN
INTRAMUSCULAR | Status: AC
  Filled 2014-07-24: qty 500

## 2014-07-24 MED ORDER — FENTANYL CITRATE (PF) 100 MCG/2ML IJ SOLN
INTRAMUSCULAR | Status: AC
  Filled 2014-07-24: qty 2

## 2014-07-24 MED ORDER — FENTANYL CITRATE (PF) 100 MCG/2ML IJ SOLN
INTRAMUSCULAR | Status: AC
Start: 2014-07-24 — End: 2014-07-24
  Filled 2014-07-24: qty 2

## 2014-07-24 MED ORDER — MIDAZOLAM HCL 2 MG/2ML IJ SOLN
INTRAMUSCULAR | Status: DC | PRN
  Administered 2014-07-24: 1 mg via INTRAVENOUS
  Administered 2014-07-24: 2 mg via INTRAVENOUS
  Administered 2014-07-24: 1 mg via INTRAVENOUS

## 2014-07-24 MED ORDER — LIDOCAINE-EPINEPHRINE (PF) 1 %-1:200000 IJ SOLN
INTRAMUSCULAR | Status: AC
  Filled 2014-07-24: qty 30

## 2014-07-24 SURGICAL SUPPLY — 5 items
PACK ANGIOGRAPHY (CUSTOM PROCEDURE TRAY) ×3 IMPLANT
SET VENACAVA FILTER RETRIEVAL (MISCELLANEOUS) ×3 IMPLANT
SHEATH 12FRX45 (SHEATH) ×3 IMPLANT
TOWEL OR 17X26 4PK STRL BLUE (TOWEL DISPOSABLE) ×3 IMPLANT
WIRE J 3MM .035X145CM (WIRE) ×3 IMPLANT

## 2014-07-24 NOTE — Discharge Instructions (Signed)
Limit your activity for the next two days after your procedure.  Avoid stooping, bending, heavy lifting or exertion as this may put pressure on the insertion site.  Resume normal activities in 48 hours.  You may shower after 24 hours but avoid excessive warm water and do not scrub the site.  Remove clear dressing in 48 hours.  If you have had a closure device inserted, do not soak in a tub bath or a hot tub for at least one week.  No driving for 48 hours after discharge.  After the procedure, check the insertion site occasionally.  If any oozing occurs or there is apparent swelling, firm pressure over the site will prevent a bruise from forming.  You can not hurt anything by pressing directly on the site.  The pressure stops the bleeding by allowing a small clot to form.  If the bleeding continues after the pressure has been applied for more than 15 minutes, call 911 or go to the nearest emergency room.    The x-ray dye causes you to pass a considerate amount of urine.  For this reason, you will be asked to drink plenty of liquids after the procedure to prevent dehydration.  You may resume you regular diet.  Avoid caffeine products.    For pain at the site of your procedure, take non-aspirin medicines such as Tylenol.  Medications: A. Hold Metformin for 48 hours if applicable.  B. Continue taking all your present medications at home unless your doctor prescribes any changes.

## 2014-07-24 NOTE — H&P (Signed)
[facility] Admission History & Physical  MRN : [id number]  Brandy Orr is a 81 y.o. (08-27-1932) female who presents with chief complaint of No chief complaint on file. Marland Kitchen  History of Present Illness: The patient presents for evaluation for removal of her IVC filter. She is now status post successful hip surgery and is completed her rehabilitation. Initial symptoms of her DVT were pain and swelling in the lower extremity. She has not had any similar symptoms recently.  The patient notes her leg continues to be somewhat painful with prolonged dependency and it does swell particularly by the end of the day. Symptoms are much improved with elevation. Patient notes minimal edema in the morning that progresses as the day progresses.  The patient has not been using compression therapy at this time.  The patient denies recent episodes of shortness of breath pleuritic chest pains. No cough or hemoptysis.  No current facility-administered medications for this encounter.    Past Medical History  Diagnosis Date  . Arthritis     Past Surgical History  Procedure Laterality Date  . Joint replacement    . Abdominal hysterectomy    . Esophagogastroduodenoscopy (egd) with propofol N/A 07/12/2014    Procedure: ESOPHAGOGASTRODUODENOSCOPY (EGD) WITH PROPOFOL;  Surgeon: Manya Silvas, MD;  Location: [facility];  Service: Endoscopy;  Laterality: N/A;    Social History History  Substance Use Topics  . Smoking status: Never Smoker   . Smokeless tobacco: Not on file  . Alcohol Use: No    Family History History reviewed. No pertinent family history. no history of porphyria or autoimmune disease  Allergies  Allergen Reactions  . Oxycodone Nausea And Vomiting     REVIEW OF SYSTEMS (Negative unless checked)  Constitutional: [] Weight loss  [] Fever  [] Chills Cardiac: [] Chest pain   [] Chest pressure   [] Palpitations   [] Shortness of breath when laying flat    [] Shortness of breath at rest   [] Shortness of breath with exertion. Vascular:  [] Pain in legs with walking   [] Pain in legs at rest   [] Pain in legs when laying flat   [] Claudication   [] Pain in feet when walking  [] Pain in feet at rest  [] Pain in feet when laying flat   [] History of DVT   [] Phlebitis   [x] Swelling in legs   [] Varicose veins   [] Non-healing ulcers Pulmonary:   [] Uses home oxygen   [] Productive cough   [] Hemoptysis   [] Wheeze  [] COPD   [] Asthma Neurologic:  [] Dizziness  [] Blackouts   [] Seizures   [] History of stroke   [] History of TIA  [] Aphasia   [] Temporary blindness   [] Dysphagia   [] Weakness or numbness in arms   [] Weakness or numbness in legs Musculoskeletal:  [x] Arthritis   [] Joint swelling   [] Joint pain   [] Low back pain Hematologic:  [] Easy bruising  [] Easy bleeding   [] Hypercoagulable state   [] Anemic  [] Hepatitis Gastrointestinal:  [] Blood in stool   [] Vomiting blood  [] Gastroesophageal reflux/heartburn   [] Difficulty swallowing. Genitourinary:  [] Chronic kidney disease   [] Difficult urination  [] Frequent urination  [] Burning with urination   [] Blood in urine Skin:  [] Rashes   [] Ulcers   [] Wounds Psychological:  [] History of anxiety   []  History of major depression.  Physical Examination  Filed Vitals:   07/24/14 1225 07/24/14 1228  BP:  142/76  Pulse:  90  Temp: 98 F (36.7 C)   TempSrc: Oral   Resp:  16  Height: 5\' 7"  (1.702  m)   Weight: 150 lb (68.04 kg)   SpO2:  96%   Body mass index is 23.49 kg/(m^2).  Head: [place]/AT, No temporalis wasting.  Ear/Nose/Throat: Hearing grossly intact, nares w/o erythema or drainage, oropharynx w/o Erythema/Exudate, Mallampati score: Class II.  Dentition good.  Eyes: PERRLA, EOMI.  Neck: Supple, no nuchal rigidity.  No bruit or JVD.  Pulmonary:  Good air movement, clear to auscultation bilaterally, no increased work of respiration or use of accessory muscles  Cardiac: RRR, normal S1, S2, no Murmurs, rubs or  gallops. Vascular: Lower extremities with 1+ pitting edema Vessel Right Left  Radial Palpable Palpable  Ulnar Palpable Palpable  Brachial Palpable Palpable  Carotid Palpable, without bruit Palpable, without bruit  Aorta Not palpable N/A  Femoral Palpable Palpable  Popliteal Palpable Palpable  PT Palpable Palpable  DP Palpable Palpable   Gastrointestinal: soft, non-tender/non-distended. No guarding/reflex. No masses, surgical incisions, or scars. Musculoskeletal: M/S 5/5 throughout.  No deformity or atrophy. Neurologic: CN 2-12 intact. Pain and light touch intact in extremities.  Symmetrical.  Speech is fluent. Motor exam as listed above. Psychiatric: Judgment intact, Mood & affect appropriate for pt's clinical situation. Dermatologic: No rashes or ulcers noted.  No cellulitis or open wounds. Lymph : No Cervical, Axillary, or Inguinal lymphadenopathy.  Diagnostic Studies None     CBC Lab Results  Component Value Date   WBC 7.6 03/11/2014   HGB 12.7 03/11/2014   HCT 37.3 03/11/2014   MCV 97 03/11/2014   PLT 341 03/11/2014    BMET    Component Value Date/Time   NA 139 03/11/2014 1411   K 3.6 03/11/2014 1411   CL 103 03/11/2014 1411   CO2 29 03/11/2014 1411   GLUCOSE 79 03/11/2014 1411   BUN 15 03/11/2014 1411   CREATININE 0.85 03/11/2014 1411   CALCIUM 9.0 03/11/2014 1411   CrCl cannot be calculated (Patient has no serum creatinine result on file.).  COAG Lab Results  Component Value Date   INR 1.0 03/11/2014    Radiology None   Assessment/Plan 1. History of DVT surrounding orthopedic operation. She has undergone secondary orthopedic procedures for which a IVC filter was placed prophylactically. She has done very well and now is returning for removal of the filter. The risks and benefits of been reviewed all questions of been answered the patient agrees to proceed with IVC filter removal. 2. Degenerative joint disease status post placement 3.  Hyperlipidemia   Bader Stubblefield, Dolores Lory, MD  07/24/2014 2:31 PM

## 2014-07-25 ENCOUNTER — Encounter: Payer: Self-pay | Admitting: Vascular Surgery

## 2015-12-05 IMAGING — CR RIGHT HIP - COMPLETE 2+ VIEW
1 series · 3 of 3 positions shown · non-contrast
Comparison: None.

CLINICAL DATA: Postop from right hip arthroplasty.

EXAM:
RIGHT HIP (WITH PELVIS) 2-3 VIEWS

[Series 1: dxr hip right complete · 0.14mm/px · 3 of 3 slices shown]
[im 1/3]
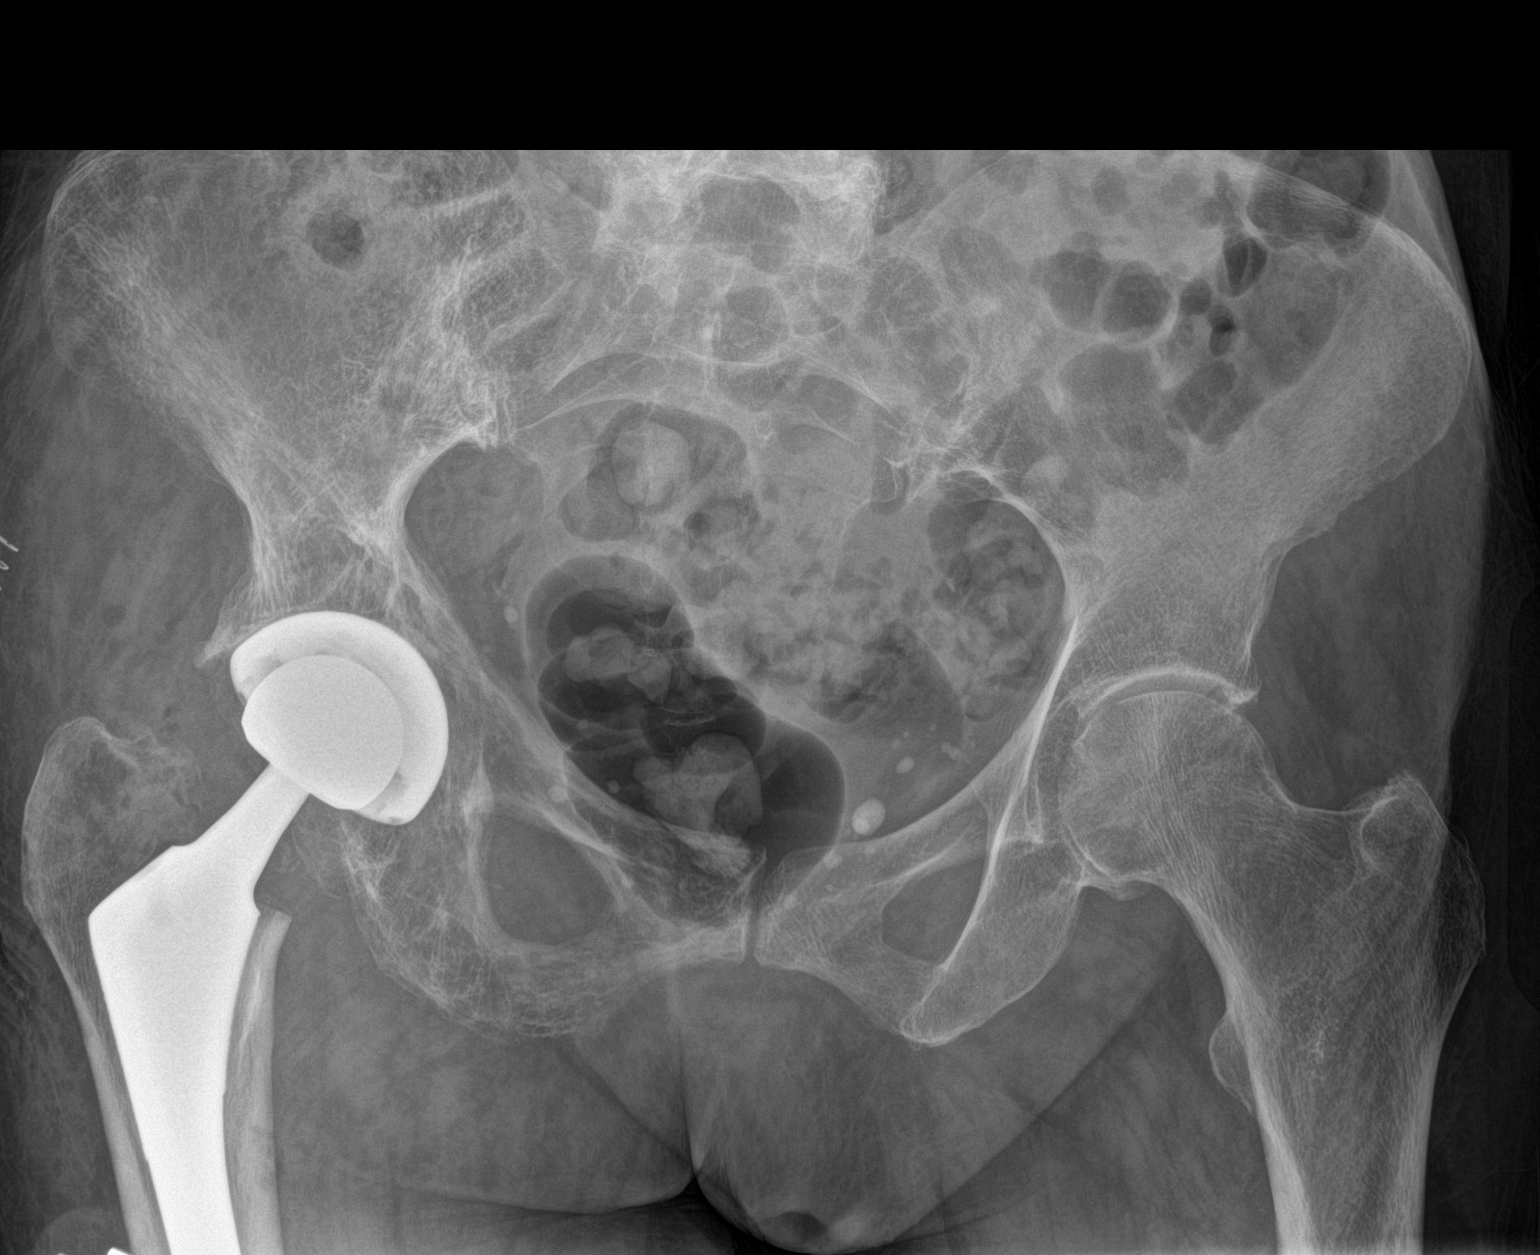
[im 2/3]
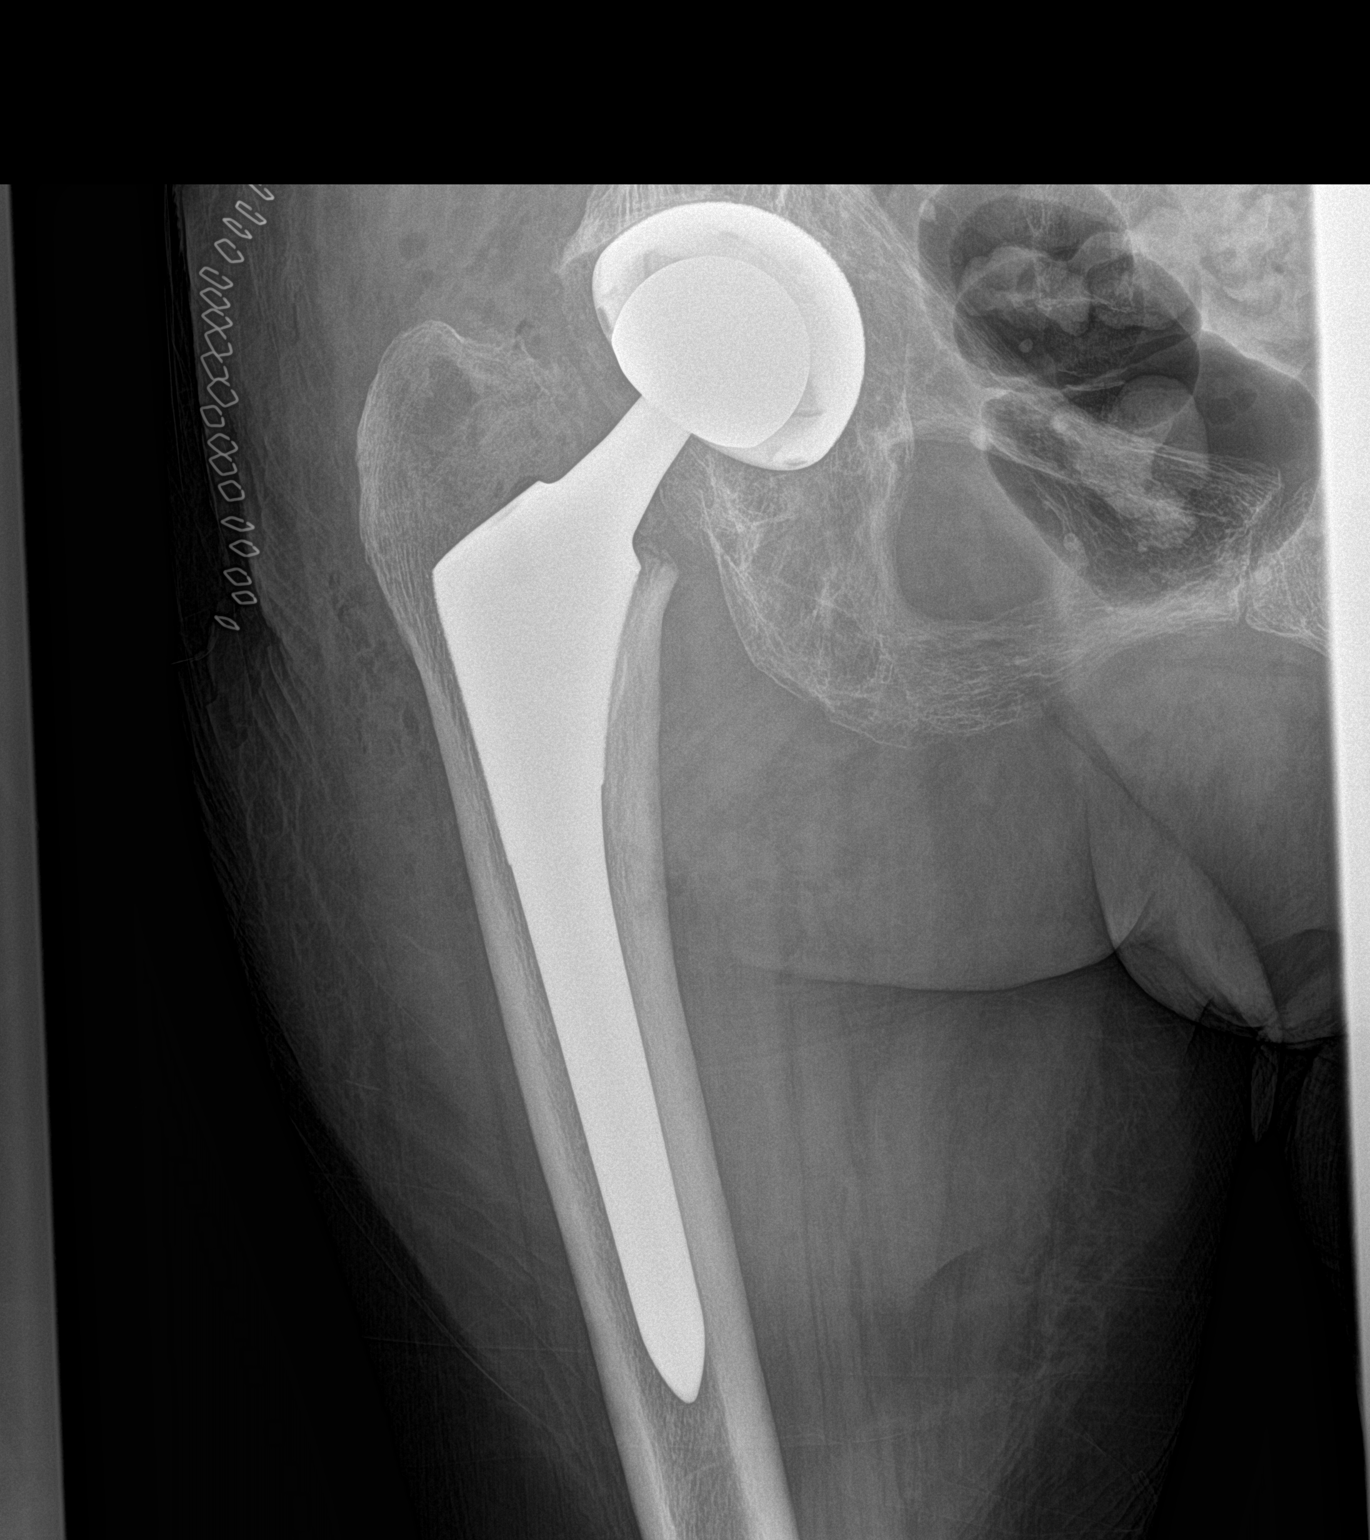
[im 3/3]
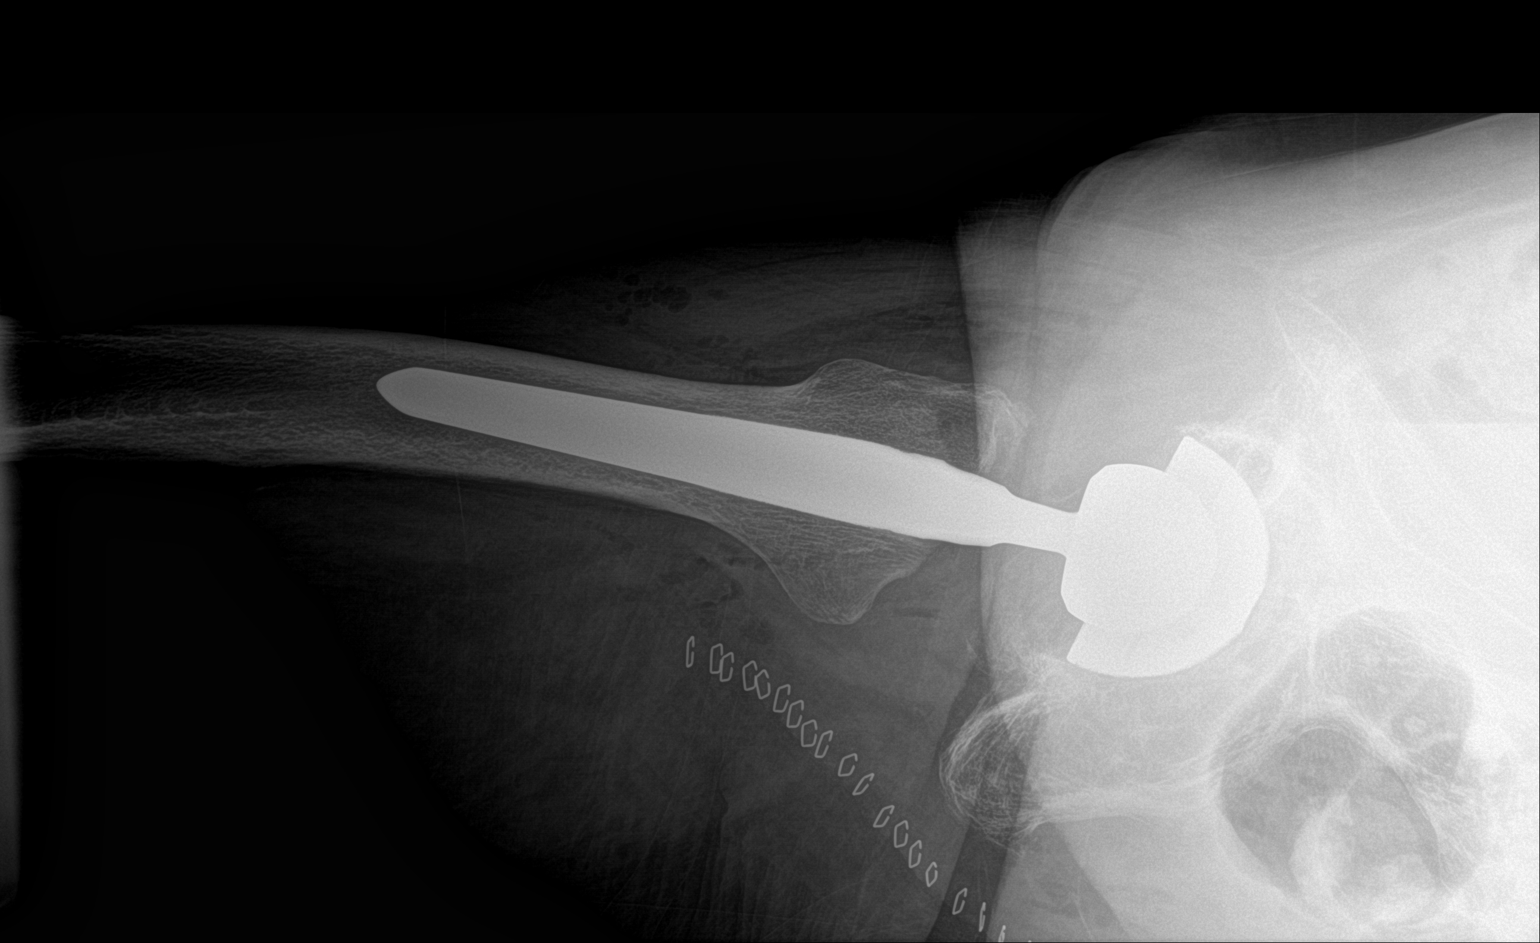

[3 of 3 positions shown; findings below may reference images not displayed]

FINDINGS: Bipolar right hip prosthesis seen in appropriate position. No
evidence of fracture or dislocation. Paget's disease again seen
involving the right hemipelvis. Generalized osteopenia also noted.
IMPRESSION: Expected postoperative appearance of right hip prosthesis. No
evidence of fracture, dislocation, or other complication.

Paget's disease involving the right hemipelvis.  Osteopenia.

## 2016-06-12 ENCOUNTER — Inpatient Hospital Stay
Admission: EM | Admit: 2016-06-12 | Discharge: 2016-06-13 | DRG: 392 | Disposition: A | Discharge status: Home / Self Care | Payer: Medicare Other | Attending: Internal Medicine | Admitting: Internal Medicine

## 2016-06-12 ENCOUNTER — Emergency Department: Payer: Medicare Other

## 2016-06-12 ENCOUNTER — Encounter: Payer: Self-pay | Admitting: Emergency Medicine

## 2016-06-12 DIAGNOSIS — K529 Noninfective gastroenteritis and colitis, unspecified: Secondary | ICD-10-CM | POA: Diagnosis not present

## 2016-06-12 DIAGNOSIS — Z90710 Acquired absence of both cervix and uterus: Secondary | ICD-10-CM

## 2016-06-12 DIAGNOSIS — Z79899 Other long term (current) drug therapy: Secondary | ICD-10-CM | POA: Diagnosis not present

## 2016-06-12 DIAGNOSIS — R112 Nausea with vomiting, unspecified: Secondary | ICD-10-CM | POA: Diagnosis present

## 2016-06-12 DIAGNOSIS — Z885 Allergy status to narcotic agent status: Secondary | ICD-10-CM

## 2016-06-12 DIAGNOSIS — R000 Tachycardia, unspecified: Secondary | ICD-10-CM | POA: Diagnosis not present

## 2016-06-12 DIAGNOSIS — M1990 Unspecified osteoarthritis, unspecified site: Secondary | ICD-10-CM | POA: Diagnosis present

## 2016-06-12 DIAGNOSIS — K219 Gastro-esophageal reflux disease without esophagitis: Secondary | ICD-10-CM | POA: Diagnosis not present

## 2016-06-12 DIAGNOSIS — R197 Diarrhea, unspecified: Secondary | ICD-10-CM

## 2016-06-12 DIAGNOSIS — Z8711 Personal history of peptic ulcer disease: Secondary | ICD-10-CM | POA: Diagnosis not present

## 2016-06-12 HISTORY — DX: Duodenal ulcer, unspecified as acute or chronic, without hemorrhage or perforation: K26.9

## 2016-06-12 LAB — COMPREHENSIVE METABOLIC PANEL
ALBUMIN: 4.4 g/dL (ref 3.5–5.0)
ALK PHOS: 83 U/L (ref 38–126)
ALT: 16 U/L (ref 14–54)
AST: 26 U/L (ref 15–41)
Anion gap: 8 (ref 5–15)
BUN: 22 mg/dL — AB (ref 6–20)
CALCIUM: 9.2 mg/dL (ref 8.9–10.3)
CO2: 25 mmol/L (ref 22–32)
CREATININE: 0.77 mg/dL (ref 0.44–1.00)
Chloride: 105 mmol/L (ref 101–111)
GFR calc Af Amer: >60 mL/min (ref >60)
GFR calc non Af Amer: >60 mL/min (ref >60)
GLUCOSE: 145 mg/dL — AB (ref 65–99)
Potassium: 3.6 mmol/L (ref 3.5–5.1)
Sodium: 138 mmol/L (ref 135–145)
Total Bilirubin: 1 mg/dL (ref 0.3–1.2)
Total Protein: 7.6 g/dL (ref 6.5–8.1)

## 2016-06-12 LAB — URINALYSIS, COMPLETE (UACMP) WITH MICROSCOPIC
Bilirubin Urine: NEGATIVE
GLUCOSE, UA: NEGATIVE mg/dL
Hgb urine dipstick: NEGATIVE
Ketones, ur: 5 mg/dL — AB
Leukocytes, UA: NEGATIVE
Nitrite: NEGATIVE
PROTEIN: NEGATIVE mg/dL
Specific Gravity, Urine: 1.015 (ref 1.005–1.030)
pH: 6 (ref 5.0–8.0)

## 2016-06-12 LAB — CBC
HCT: 39.6 % (ref 35.0–47.0)
Hemoglobin: 13.4 g/dL (ref 12.0–16.0)
MCH: 32.2 pg (ref 26.0–34.0)
MCHC: 33.8 g/dL (ref 32.0–36.0)
MCV: 95.4 fL (ref 80.0–100.0)
PLATELETS: 320 10*3/uL (ref 150–440)
RBC: 4.15 MIL/uL (ref 3.80–5.20)
RDW: 13.1 % (ref 11.5–14.5)
WBC: 11 10*3/uL (ref 3.6–11.0)

## 2016-06-12 LAB — LIPASE, BLOOD: Lipase: 26 U/L (ref 11–51)

## 2016-06-12 MED ORDER — ONDANSETRON HCL 4 MG/2ML IJ SOLN: 4.0000 mg | Freq: Four times a day (QID) | INTRAMUSCULAR | Status: DC | PRN

## 2016-06-12 MED ORDER — ACETAMINOPHEN 325 MG PO TABS
650.0000 mg | ORAL_TABLET | Freq: Four times a day (QID) | ORAL | Status: DC | PRN
  Administered 2016-06-12 – 2016-06-13 (×2): 650 mg via ORAL
  Filled 2016-06-12 (×2): qty 2

## 2016-06-12 MED ORDER — ONDANSETRON 4 MG PO TBDP: 4.0000 mg | ORAL_TABLET | Freq: Three times a day (TID) | ORAL | 0 refills | Status: DC | PRN

## 2016-06-12 MED ORDER — ENOXAPARIN SODIUM 40 MG/0.4ML ~~LOC~~ SOLN
40.0000 mg | SUBCUTANEOUS | Status: DC
  Filled 2016-06-12: qty 0.4

## 2016-06-12 MED ORDER — ONDANSETRON HCL 4 MG/2ML IJ SOLN
4.0000 mg | Freq: Once | INTRAMUSCULAR | Status: AC | PRN
  Administered 2016-06-12: 4 mg via INTRAVENOUS
  Filled 2016-06-12: qty 2

## 2016-06-12 MED ORDER — SODIUM CHLORIDE 0.9 % IV BOLUS (SEPSIS)
1000.0000 mL | Freq: Once | INTRAVENOUS | Status: AC
  Administered 2016-06-12: 1000 mL via INTRAVENOUS

## 2016-06-12 MED ORDER — POTASSIUM CHLORIDE IN NACL 20-0.9 MEQ/L-% IV SOLN
INTRAVENOUS | Status: DC
  Administered 2016-06-12 – 2016-06-13 (×2): via INTRAVENOUS
  Filled 2016-06-12 (×4): qty 1000

## 2016-06-12 MED ORDER — ONDANSETRON HCL 4 MG/2ML IJ SOLN
4.0000 mg | Freq: Once | INTRAMUSCULAR | Status: AC
Start: 2016-06-12 — End: 2016-06-12
  Administered 2016-06-12: 4 mg via INTRAVENOUS
  Filled 2016-06-12: qty 2

## 2016-06-12 MED ORDER — PROMETHAZINE HCL 25 MG/ML IJ SOLN
12.5000 mg | Freq: Four times a day (QID) | INTRAMUSCULAR | Status: DC | PRN
  Administered 2016-06-12: 12.5 mg via INTRAVENOUS
  Filled 2016-06-12: qty 1

## 2016-06-12 MED ORDER — ONDANSETRON HCL 4 MG PO TABS: 4.0000 mg | ORAL_TABLET | Freq: Four times a day (QID) | ORAL | Status: DC | PRN

## 2016-06-12 MED ORDER — ACETAMINOPHEN 650 MG RE SUPP: 650.0000 mg | Freq: Four times a day (QID) | RECTAL | Status: DC | PRN

## 2016-06-12 MED ORDER — PANTOPRAZOLE SODIUM 40 MG IV SOLR
40.0000 mg | Freq: Two times a day (BID) | INTRAVENOUS | Status: DC
  Administered 2016-06-12 – 2016-06-13 (×2): 40 mg via INTRAVENOUS
  Filled 2016-06-12 (×2): qty 40

## 2016-06-12 MED ORDER — LOPERAMIDE HCL 2 MG PO TABS: 2.0000 mg | ORAL_TABLET | Freq: Four times a day (QID) | ORAL | 0 refills | Status: DC | PRN

## 2016-06-12 NOTE — H&P (Signed)
[place] [facility] [place] NAME: Brandy Orr    MR#:  [id number]  DATE OF BIRTH:  05-07-1932  DATE OF ADMISSION:  06/12/2016  PRIMARY CARE PHYSICIAN: Glendon Axe, MD   REQUESTING/REFERRING PHYSICIAN: Dr. Larae Grooms  CHIEF COMPLAINT:   Chief Complaint  Patient presents with  . Emesis  . Diarrhea    HISTORY OF PRESENT ILLNESS:  Brandy Orr  is a 83 y.o. female with a known history of Duodenal ulcer, osteoarthritis, GERD who presents to the hospital due to nausea vomiting and diarrhea that began early this morning. Patient says that she's had multiple episodes of nausea vomiting diarrhea since 3 AM this morning, the vomiting is clear in nature and nonbloody. The diarrhea was loose brown in nature with no blood. Given the persistence of her symptoms she was concerned and therefore came to the ER for further evaluation. Patient received some IV fluids in the ER along with 3 doses of IV Zofran but still has persistent nausea and vomiting and therefore hospitalist services were contacted for further treatment and evaluation. Patient denies any abdominal pain, fever, chills, recent sick contacts, any other associated symptoms presently. She does say that she bought some barbecue and ate Pulled pork sandwiches for the past 2 days, but her symptoms began early this morning.   PAST MEDICAL HISTORY:   Past Medical History:  Diagnosis Date  . Arthritis   . Duodenal ulcer     PAST SURGICAL HISTORY:   Past Surgical History:  Procedure Laterality Date  . ABDOMINAL HYSTERECTOMY    . ESOPHAGOGASTRODUODENOSCOPY (EGD) WITH PROPOFOL N/A 07/12/2014   Procedure: ESOPHAGOGASTRODUODENOSCOPY (EGD) WITH PROPOFOL;  Surgeon: Manya Silvas, MD;  Location: [facility] ENDOSCOPY;  Service: Endoscopy;  Laterality: N/A;  . JOINT REPLACEMENT    . PERIPHERAL VASCULAR CATHETERIZATION N/A 07/24/2014   Procedure: IVC Filter Removal;  Surgeon: Katha Cabal, MD;   Location: [place] CV LAB;  Service: Cardiovascular;  Laterality: N/A;    SOCIAL HISTORY:   Social History  Substance Use Topics  . Smoking status: Never Smoker  . Smokeless tobacco: Never Used  . Alcohol use No    FAMILY HISTORY:   Family History  Problem Relation Age of Onset  . Leukemia Mother   . Diabetes Mother   . Heart disease Father   . Diabetes Father     DRUG ALLERGIES:   Allergies  Allergen Reactions  . Oxycodone Nausea And Vomiting    REVIEW OF SYSTEMS:   Review of Systems  Constitutional: Negative for fever and weight loss.  HENT: Negative for congestion, nosebleeds and tinnitus.   Eyes: Negative for blurred vision, double vision and redness.  Respiratory: Negative for cough, hemoptysis and shortness of breath.   Cardiovascular: Negative for chest pain, orthopnea, leg swelling and PND.  Gastrointestinal: Positive for diarrhea, nausea and vomiting. Negative for abdominal pain and melena.  Genitourinary: Negative for dysuria, hematuria and urgency.  Musculoskeletal: Negative for falls and joint pain.  Neurological: Negative for dizziness, tingling, sensory change, focal weakness, seizures, weakness and headaches.  Endo/Heme/Allergies: Negative for polydipsia. Does not bruise/bleed easily.  Psychiatric/Behavioral: Negative for depression and memory loss. The patient is not nervous/anxious.     MEDICATIONS AT HOME:   Prior to Admission medications   Medication Sig Start Date End Date Taking? Authorizing Provider  acetaminophen (TYLENOL) 650 MG CR tablet Take 1,300 mg by mouth every 8 (eight) hours as needed for pain.   Yes [provider]  COSAMIN DS 500-400 MG CAPS Take 1 tablet by mouth 2 (two) times daily. 04/12/14  Yes [provider]  Multiple Vitamins-Calcium (ONE-A-DAY WITHIN PO) Take 1 tablet by mouth daily.   Yes [provider]  pantoprazole (PROTONIX) 40 MG tablet Take 1 tablet by mouth daily. 06/11/14  Yes [provider]  traMADol-acetaminophen (ULTRACET) 37.5-325 MG tablet Take 1 tablet by mouth every 8 (eight) hours as needed. 05/26/16  Yes [provider]  loperamide (IMODIUM A-D) 2 MG tablet Take 1 tablet (2 mg total) by mouth 4 (four) times daily as needed for diarrhea or loose stools. 06/12/16   Harvest Dark, MD  ondansetron (ZOFRAN ODT) 4 MG disintegrating tablet Take 1 tablet (4 mg total) by mouth every 8 (eight) hours as needed for nausea or vomiting. 06/12/16   Harvest Dark, MD      VITAL SIGNS:  Blood pressure 128/79, pulse (!) 111, temperature 97.9 F (36.6 C), temperature source Oral, resp. rate 18, height 5\' 7"  (1.702 m), weight 72.6 kg (160 lb), SpO2 96 %.  PHYSICAL EXAMINATION:  Physical Exam  GENERAL:  83 y.o.-year-old patient lying in bed in no acute distress.  EYES: Pupils equal, round, reactive to light and accommodation. No scleral icterus. Extraocular muscles intact.  HEENT: Head atraumatic, normocephalic. Oropharynx and nasopharynx clear. No oropharyngeal erythema, moist oral mucosa  NECK:  Supple, no jugular venous distention. No thyroid enlargement, no tenderness.  LUNGS: Normal breath sounds bilaterally, no wheezing, rales, rhonchi. No use of accessory muscles of respiration.  CARDIOVASCULAR: S1, S2 RRR. No murmurs, rubs, gallops, clicks.  ABDOMEN: Soft, nontender, nondistended. Bowel sounds present. No organomegaly or mass.  EXTREMITIES: No pedal edema, cyanosis, or clubbing. + 2 pedal & radial pulses b/l.   NEUROLOGIC: Cranial nerves II through XII are intact. No focal Motor or sensory deficits appreciated b/l PSYCHIATRIC: The patient is alert and oriented x 3.  SKIN: No obvious rash, lesion, or ulcer.   LABORATORY PANEL:   CBC  Recent Labs Lab 06/12/16 1302  WBC 11.0  HGB 13.4  HCT 39.6  PLT 320   ------------------------------------------------------------------------------------------------------------------  Chemistries   Recent  Labs Lab 06/12/16 1302  NA 138  K 3.6  CL 105  CO2 25  GLUCOSE 145*  BUN 22*  CREATININE 0.77  CALCIUM 9.2  AST 26  ALT 16  ALKPHOS 83  BILITOT 1.0   ------------------------------------------------------------------------------------------------------------------  Cardiac Enzymes No results for input(s): TROPONINI in the last 168 hours. ------------------------------------------------------------------------------------------------------------------  RADIOLOGY:  Dg Abdomen Acute W/chest  Result Date: 06/12/2016 CLINICAL DATA:  Diarrhea since last night and vomiting starting early this morning. Hx - previous duodenal ulcer, abdominal hysterectomy. EXAM: DG ABDOMEN ACUTE W/ 1V CHEST COMPARISON:  None. FINDINGS: Frontal view of the chest demonstrates midline trachea. Mild cardiomegaly. Atherosclerosis in the transverse aorta. No pleural effusion or pneumothorax. Clear lungs. Abdominal films demonstrate No free intraperitoneal air. No significant air fluid levels. No gaseous distention of bowel loops on supine imaging. Distal gas and stool. Vascular calcifications. No appendicolith. Right hip arthroplasty. Convex left lumbar spine curvature. High-riding humeral heads, consistent with chronic rotator cuff insufficiency. IMPRESSION: No acute findings. Mild cardiomegaly.  Aortic atherosclerosis. Electronically Signed   By: Abigail Miyamoto M.D.   On: 06/12/2016 14:40     IMPRESSION AND PLAN:   83 year old female with past medical history of duodenal ulcer, osteoarthritis who presents to the hospital due to nausea vomiting and diarrhea.  1. Intractable nausea vomiting-secondary to a viral illness, abdominal x-rays negative  for any obstruction or ileus. -Supportive care with IV fluids, antiemetics. We'll keep nothing by mouth for now. -If not improving consider getting a gastroenterology consult.  2. GERD - will place on IV protonix.  3. Hx of Duodenal Ulcer - cont. IV Protonix.     All  the records are reviewed and case discussed with ED provider. Management plans discussed with the patient, family and they are in agreement.  CODE STATUS: Full code  TOTAL TIME TAKING CARE OF THIS PATIENT: 40 minutes.    Henreitta Leber M.D on 06/12/2016 at 6:33 PM  Between 7am to 6pm - Pager - [phone]  After 6pm go to [email or web address] - password [email or web address]  [place] [facility]  Office  [phone]  CC: Primary care physician; Glendon Axe, MD

## 2016-06-12 NOTE — ED Provider Notes (Signed)
[facility] Emergency Department Provider Note  Time seen: 2:07 PM  I have reviewed the triage vital signs and the nursing notes.   HISTORY  Chief Complaint Emesis and Diarrhea    HPI Brandy Orr is a 83 y.o. female with a past medical history of arthritis, who presents to the emergency department for nausea vomiting diarrhea. According to the patient she had been feeling well until 3:00 this morning when she suddenly awoke feeling the urge to have a bowel movement. Patient states she went to have a bowel movement which was diarrhea and then vomited. She states since that time she has had multiple episodes of vomiting and diarrhea since. She states the vomiting was initially clear but turned a yellow/green color. Denies any abdominal pain or fever. Denies dysuria.  Past Medical History:  Diagnosis Date  . Arthritis   . Duodenal ulcer     There are no active problems to display for this patient.   Past Surgical History:  Procedure Laterality Date  . ABDOMINAL HYSTERECTOMY    . ESOPHAGOGASTRODUODENOSCOPY (EGD) WITH PROPOFOL N/A 07/12/2014   Procedure: ESOPHAGOGASTRODUODENOSCOPY (EGD) WITH PROPOFOL;  Surgeon: Manya Silvas, MD;  Location: [facility] ENDOSCOPY;  Service: Endoscopy;  Laterality: N/A;  . JOINT REPLACEMENT    . PERIPHERAL VASCULAR CATHETERIZATION N/A 07/24/2014   Procedure: IVC Filter Removal;  Surgeon: Katha Cabal, MD;  Location: [place] [facility];  Service: Cardiovascular;  Laterality: N/A;    Prior to Admission medications   Medication Sig Start Date End Date Taking? Authorizing Provider  acetaminophen (TYLENOL) 650 MG CR tablet Take 1,300 mg by mouth every 8 (eight) hours as needed for pain.    [provider]  COSAMIN DS 500-400 MG CAPS Take 1 tablet by mouth 2 (two) times daily. 04/12/14   [provider]  docusate sodium (COLACE) 100 MG capsule Take 100 mg by mouth daily as needed for mild constipation.     [provider]  pantoprazole (PROTONIX) 40 MG tablet Take 1 tablet by mouth daily. 06/11/14   [provider]    Allergies  Allergen Reactions  . Oxycodone Nausea And Vomiting    History reviewed. No pertinent family history.  Social History Social History  Substance Use Topics  . Smoking status: Never Smoker  . Smokeless tobacco: Never Used  . Alcohol use No    Review of Systems Constitutional: Negative for fever. Eyes: Negative for visual changes. ENT: Negative for congestion Cardiovascular: Negative for chest pain. Respiratory: Negative for shortness of breath. Gastrointestinal: Negative for abdominal pain. Positive for nausea vomiting and diarrhea Genitourinary: Negative for dysuria. Musculoskeletal: Negative for back pain. Skin: Negative for rash. Neurological: Negative for headache All other ROS negative  ____________________________________________   PHYSICAL EXAM:  VITAL SIGNS: ED Triage Vitals [06/12/16 1300]  Enc Vitals Group     BP (!) 124/100     Pulse Rate (!) 103     Resp 18     Temp 97.9 F (36.6 C)     Temp Source Oral     SpO2 96 %     Weight 160 lb (72.6 kg)     Height 5\' 7"  (1.702 m)     Head Circumference      Peak Flow      Pain Score 0     Pain Loc      Pain Edu?      Excl. in [place]?     Constitutional: Alert and oriented. Well appearing  and in no distress. Eyes: Normal exam ENT   Head: Normocephalic and atraumatic.   Mouth/Throat: Mucous membranes are moist. Cardiovascular: Normal rate, regular rhythm.  Respiratory: Normal respiratory effort without tachypnea nor retractions. Breath sounds are clear  Gastrointestinal: Soft, nontender. No distention. Musculoskeletal: Nontender with normal range of motion in all extremities.  Neurologic:  Normal speech and language. No gross focal neurologic deficits Skin:  Skin is warm, dry and intact.  Psychiatric: Mood and affect are normal.    ____________________________________________   RADIOLOGY  X-ray negative  ____________________________________________   INITIAL IMPRESSION / ASSESSMENT AND PLAN / ED COURSE  Pertinent labs & imaging results that were available during my care of the patient were reviewed by me and considered in my medical decision making (see chart for details).  The patient presents to the emergency department and nausea vomiting diarrhea starting at 3:00 this morning. Patient's symptoms are very suggestive of gastroenteritis. Patient has a soft and nontender abdomen. We will continue IV hydrating, we will treat with Zofran and obtain a three-way abdominal x-ray to rule out obstructive gas pattern, very low suspicion for sbo.  X-ray shows normal bowel gas pattern, no acute findings. Patient continues to appear well in the emergency department. We'll continue with IV hydration. Urinalysis is pending. I discussed likely discharge, nausea medication if urinalysis is normal for gastritis. Patient care signed out to Dr. Clearnce Hasten.  ____________________________________________   FINAL CLINICAL IMPRESSION(S) / ED DIAGNOSES  Nausea vomiting diarrhea Gastroenteritis   Harvest Dark, MD 06/12/16 1510

## 2016-06-12 NOTE — Discharge Instructions (Signed)
Please take your prescribed nausea and diarrhea medication as written, as needed. Please drink plenty of fluids and obtain plenty of rest. Please follow-up with your doctor on Monday for recheck/reevaluation. Return to the emergency department for any increased abdominal pain, fever, or if you are unable to tolerate fluids due to vomiting.

## 2016-06-12 NOTE — ED Provider Notes (Signed)
Sign off from Dr. Kerman Passey in this 83 year old female with nausea vomiting and diarrhea. Plan to follow-up with patient's urinalysis as well as reassess her heart rate of the second liter of fluids.  Physical Exam  BP 128/79   Pulse (!) 111   Temp 97.9 F (36.6 C) (Oral)   Resp 18   Ht 5\' 7"  (1.702 m)   Wt 160 lb (72.6 kg)   SpO2 96%   BMI 25.06 kg/m  ----------------------------------------- 6:11 PM on 06/12/2016 -----------------------------------------   Physical Exam Patient vomiting while I was in the room. Vomitus was clear. Patient's abdomen evaluated and is soft, nontender nondistended. ED Course  Procedures  MDM Patient vomited after trying water after 2 rounds of Zofran as well as 2 L of fluid. She is also persistently tachycardic to about 118. She'll be admitted to the hospital. Signed out to Dr. Wynetta Emery.       Orbie Pyo, MD 06/12/16 [phone]

## 2016-06-12 NOTE — ED Notes (Signed)
Patient ambulated to restroom with assistance. Gait at baseline per patient.

## 2016-06-12 NOTE — ED Triage Notes (Signed)
Pt arrived via POV, states she has been vomiting since 3am this morning. Pt has hx of ulcer. States it started clear and turned to green. Denies any coffee ground emesis. Last vomited 1130am. C/o intermittent nausea. Reports some diarrhea when vomiting. States she is vomiting about once every hour.

## 2016-06-13 DIAGNOSIS — K529 Noninfective gastroenteritis and colitis, unspecified: Secondary | ICD-10-CM | POA: Diagnosis not present

## 2016-06-13 DIAGNOSIS — R112 Nausea with vomiting, unspecified: Secondary | ICD-10-CM | POA: Diagnosis not present

## 2016-06-13 LAB — BASIC METABOLIC PANEL
Anion gap: 8 (ref 5–15)
BUN: 17 mg/dL (ref 6–20)
CALCIUM: 8.7 mg/dL — AB (ref 8.9–10.3)
CO2: 24 mmol/L (ref 22–32)
CREATININE: 0.72 mg/dL (ref 0.44–1.00)
Chloride: 110 mmol/L (ref 101–111)
GFR calc Af Amer: >60 mL/min (ref >60)
GFR calc non Af Amer: >60 mL/min (ref >60)
GLUCOSE: 108 mg/dL — AB (ref 65–99)
Potassium: 3.6 mmol/L (ref 3.5–5.1)
Sodium: 142 mmol/L (ref 135–145)

## 2016-06-13 MED ORDER — TRAMADOL HCL 50 MG PO TABS
50.0000 mg | ORAL_TABLET | Freq: Two times a day (BID) | ORAL | Status: DC | PRN
Start: 2016-06-13 — End: 2016-06-13

## 2016-06-13 MED ORDER — ONDANSETRON 4 MG PO TBDP: 4.0000 mg | ORAL_TABLET | Freq: Three times a day (TID) | ORAL | 0 refills | Status: DC | PRN

## 2016-06-13 MED ORDER — PANTOPRAZOLE SODIUM 40 MG PO TBEC: 40.0000 mg | DELAYED_RELEASE_TABLET | Freq: Every day | ORAL | Status: DC

## 2016-06-13 NOTE — Discharge Summary (Signed)
[place] at [place] NAME: Brandy Orr    MR#:  [id number]  DATE OF BIRTH:  [birth year]  DATE OF ADMISSION:  06/12/2016 ADMITTING PHYSICIAN: Henreitta Leber, MD  DATE OF DISCHARGE:06/13/2016  PRIMARY CARE PHYSICIAN: Glendon Axe, MD    ADMISSION DIAGNOSIS:  Nausea vomiting and diarrhea [R11.2, R19.7]  DISCHARGE DIAGNOSIS:  Acute gastroenteritis  SECONDARY DIAGNOSIS:   Past Medical History:  Diagnosis Date  . Arthritis   . Duodenal ulcer     HOSPITAL COURSE:   83 year old female with past medical history of duodenal ulcer, osteoarthritis who presents to the hospital due to nausea vomiting and diarrhea.  1. Intractable nausea vomiting-secondary to a viral illness, abdominal x-rays negative for any obstruction or ileus. -Received Supportive care with IV fluids, antiemetics.  -Full liquid diet -No more nausea vomiting Patient's H&H and metabolic panel within normal limits  2. GERD -continue protonix.  3. Hx of Duodenal Ulcer -continue PPI   Discussed with patient and family  CONSULTS OBTAINED:    DRUG ALLERGIES:   Allergies  Allergen Reactions  . Oxycodone Nausea And Vomiting    DISCHARGE MEDICATIONS:   Current Discharge Medication List    START taking these medications   Details  loperamide (IMODIUM A-D) 2 MG tablet Take 1 tablet (2 mg total) by mouth 4 (four) times daily as needed for diarrhea or loose stools. Qty: 20 tablet, Refills: 0    ondansetron (ZOFRAN ODT) 4 MG disintegrating tablet Take 1 tablet (4 mg total) by mouth every 8 (eight) hours as needed for nausea or vomiting. Qty: 20 tablet, Refills: 0      CONTINUE these medications which have NOT CHANGED   Details  acetaminophen (TYLENOL) 650 MG CR tablet Take 1,300 mg by mouth every 8 (eight) hours as needed for pain.    COSAMIN DS 500-400 MG CAPS Take 1 tablet by mouth 2 (two) times daily. Refills: 0    Multiple Vitamins-Calcium  (ONE-A-DAY WITHIN PO) Take 1 tablet by mouth daily.    pantoprazole (PROTONIX) 40 MG tablet Take 1 tablet by mouth daily. Refills: 12    traMADol-acetaminophen (ULTRACET) 37.5-325 MG tablet Take 1 tablet by mouth every 8 (eight) hours as needed. Refills: 3        If you experience worsening of your admission symptoms, develop shortness of breath, life threatening emergency, suicidal or homicidal thoughts you must seek medical attention immediately by calling 911 or calling your MD immediately  if symptoms less severe.  You Must read complete instructions/literature along with all the possible adverse reactions/side effects for all the Medicines you take and that have been prescribed to you. Take any new Medicines after you have completely understood and accept all the possible adverse reactions/side effects.   Please note  You were cared for by a hospitalist during your hospital stay. If you have any questions about your discharge medications or the care you received while you were in the hospital after you are discharged, you can call the unit and asked to speak with the hospitalist on call if the hospitalist that took care of you is not available. Once you are discharged, your primary care physician will handle any further medical issues. Please note that NO REFILLS for any discharge medications will be authorized once you are discharged, as it is imperative that you return to your primary care physician (or establish a relationship with a primary care physician if you do not have one) for your  aftercare needs so that they can reassess your need for medications and monitor your lab values. Today   SUBJECTIVE    Doing well VITAL SIGNS:  Blood pressure 118/62, pulse 98, temperature 99.1 F (37.3 C), temperature source Oral, resp. rate 16, height 5\' 9"  (1.753 m), weight 71.9 kg (158 lb 9.6 oz), SpO2 97 %.  I/O:   Intake/Output Summary (Last 24 hours) at 06/13/16 1129 Last data filed at  06/13/16 0957  Gross per 24 hour  Intake             2733 ml  Output                0 ml  Net             2733 ml    PHYSICAL EXAMINATION:  GENERAL:  83 y.o.-year-old patient lying in the bed with no acute distress.  EYES: Pupils equal, round, reactive to light and accommodation. No scleral icterus. Extraocular muscles intact.  HEENT: Head atraumatic, normocephalic. Oropharynx and nasopharynx clear.  NECK:  Supple, no jugular venous distention. No thyroid enlargement, no tenderness.  LUNGS: Normal breath sounds bilaterally, no wheezing, rales,rhonchi or crepitation. No use of accessory muscles of respiration.  CARDIOVASCULAR: S1, S2 normal. No murmurs, rubs, or gallops.  ABDOMEN: Soft, non-tender, non-distended. Bowel sounds present. No organomegaly or mass.  EXTREMITIES: No pedal edema, cyanosis, or clubbing.  NEUROLOGIC: Cranial nerves II through XII are intact. Muscle strength 5/5 in all extremities. Sensation intact. Gait not checked.  PSYCHIATRIC: The patient is alert and oriented x 3.  SKIN: No obvious rash, lesion, or ulcer.   DATA REVIEW:   CBC   Recent Labs Lab 06/12/16 1302  WBC 11.0  HGB 13.4  HCT 39.6  PLT 320    Chemistries   Recent Labs Lab 06/12/16 1302 06/13/16 0529  NA 138 142  K 3.6 3.6  CL 105 110  CO2 25 24  GLUCOSE 145* 108*  BUN 22* 17  CREATININE 0.77 0.72  CALCIUM 9.2 8.7*  AST 26  --   ALT 16  --   ALKPHOS 83  --   BILITOT 1.0  --     Microbiology Results   No results found for this or any previous visit (from the past 240 hour(s)).  RADIOLOGY:  Dg Abdomen Acute W/chest  Result Date: 06/12/2016 CLINICAL DATA:  Diarrhea since last night and vomiting starting early this morning. Hx - previous duodenal ulcer, abdominal hysterectomy. EXAM: DG ABDOMEN ACUTE W/ 1V CHEST COMPARISON:  None. FINDINGS: Frontal view of the chest demonstrates midline trachea. Mild cardiomegaly. Atherosclerosis in the transverse aorta. No pleural effusion or  pneumothorax. Clear lungs. Abdominal films demonstrate No free intraperitoneal air. No significant air fluid levels. No gaseous distention of bowel loops on supine imaging. Distal gas and stool. Vascular calcifications. No appendicolith. Right hip arthroplasty. Convex left lumbar spine curvature. High-riding humeral heads, consistent with chronic rotator cuff insufficiency. IMPRESSION: No acute findings. Mild cardiomegaly.  Aortic atherosclerosis. Electronically Signed   By: Abigail Miyamoto M.D.   On: 06/12/2016 14:40     Management plans discussed with the patient, family and they are in agreement.  CODE STATUS:     Code Status Orders        Start     Ordered   06/12/16 1934  Full code  Continuous     06/12/16 1933    Code Status History    Date Active Date Inactive Code Status Order ID Comments User  Context   07/24/2014  3:36 PM 07/24/2014  6:56 PM Full Code [id number]  Katha Cabal, MD Inpatient   07/24/2014  3:36 PM 07/24/2014  3:36 PM Full Code [id number]  Schnier, Dolores Lory, MD Inpatient    Advance Directive Documentation     Most Recent Value  Type of Advance Directive  Healthcare Power of Attorney  Pre-existing out of facility DNR order (yellow form or pink MOST form)  -  "MOST" Form in Place?  -      TOTAL TIME TAKING CARE OF THIS PATIENT: 40 minutes.    Ziyana Morikawa M.D on 06/13/2016 at 11:29 AM  Between 7am to 6pm - Pager - [phone] After 6pm go to [email or web address] - password [email or web address] [place] [facility]  Office  [phone]  CC: Primary care physician; Glendon Axe, MD

## 2016-06-13 NOTE — Progress Notes (Signed)
Discharge instructions reviewed with the patient.  rx given to the patient

## 2016-09-30 ENCOUNTER — Other Ambulatory Visit: Payer: Self-pay | Admitting: Internal Medicine

## 2016-09-30 DIAGNOSIS — R1031 Right lower quadrant pain: Secondary | ICD-10-CM

## 2016-10-18 ENCOUNTER — Ambulatory Visit
Admission: RE | Admit: 2016-10-18 | Discharge: 2016-10-18 | Disposition: A | Discharge status: Home / Self Care | Payer: Medicare Other | Source: Ambulatory Visit | Attending: Internal Medicine | Admitting: Internal Medicine

## 2016-10-18 DIAGNOSIS — R1031 Right lower quadrant pain: Secondary | ICD-10-CM | POA: Diagnosis not present

## 2016-10-18 DIAGNOSIS — I700 Atherosclerosis of aorta: Secondary | ICD-10-CM | POA: Insufficient documentation

## 2016-10-18 DIAGNOSIS — K8020 Calculus of gallbladder without cholecystitis without obstruction: Secondary | ICD-10-CM | POA: Diagnosis not present

## 2016-10-18 HISTORY — DX: Malignant (primary) neoplasm, unspecified: C80.1

## 2016-10-18 MED ORDER — IOPAMIDOL (ISOVUE-300) INJECTION 61%
100.0000 mL | Freq: Once | INTRAVENOUS | Status: AC | PRN
  Administered 2016-10-18: 100 mL via INTRAVENOUS

## 2017-01-04 ENCOUNTER — Encounter
Admission: RE | Admit: 2017-01-04 | Discharge: 2017-01-04 | Disposition: A | Discharge status: Home / Self Care | Payer: Medicare Other | Source: Ambulatory Visit | Attending: Obstetrics and Gynecology | Admitting: Obstetrics and Gynecology

## 2017-01-04 ENCOUNTER — Other Ambulatory Visit: Payer: Self-pay

## 2017-01-04 DIAGNOSIS — I498 Other specified cardiac arrhythmias: Secondary | ICD-10-CM | POA: Diagnosis not present

## 2017-01-04 DIAGNOSIS — Z01818 Encounter for other preprocedural examination: Secondary | ICD-10-CM | POA: Insufficient documentation

## 2017-01-04 HISTORY — DX: Gastric ulcer, unspecified as acute or chronic, without hemorrhage or perforation: K25.9

## 2017-01-04 LAB — BASIC METABOLIC PANEL
ANION GAP: 8 (ref 5–15)
BUN: 16 mg/dL (ref 6–20)
CHLORIDE: 107 mmol/L (ref 101–111)
CO2: 25 mmol/L (ref 22–32)
Calcium: 9.2 mg/dL (ref 8.9–10.3)
Creatinine, Ser: 0.82 mg/dL (ref 0.44–1.00)
GFR calc non Af Amer: >60 mL/min (ref >60)
Glucose, Bld: 97 mg/dL (ref 65–99)
Potassium: 3.7 mmol/L (ref 3.5–5.1)
Sodium: 140 mmol/L (ref 135–145)

## 2017-01-04 LAB — TYPE AND SCREEN
ABO/RH(D): O POS
Antibody Screen: NEGATIVE

## 2017-01-04 LAB — CBC
HCT: 37.6 % (ref 35.0–47.0)
HEMOGLOBIN: 12.8 g/dL (ref 12.0–16.0)
MCH: 33.1 pg (ref 26.0–34.0)
MCHC: 34 g/dL (ref 32.0–36.0)
MCV: 97.3 fL (ref 80.0–100.0)
Platelets: 282 10*3/uL (ref 150–440)
RBC: 3.86 MIL/uL (ref 3.80–5.20)
RDW: 13.3 % (ref 11.5–14.5)
WBC: 5.3 10*3/uL (ref 3.6–11.0)

## 2017-01-04 NOTE — Patient Instructions (Signed)
Your procedure is scheduled on: Monday 01/10/17 Report to [facility]. 2ND FLOOR MEDICAL MALL ENTRANCE. To find out your arrival time please call [phone] between 1PM - 3PM on Friday 01/07/17.  Remember: Instructions that are not followed completely may result in serious medical risk, up to and including death, or upon the discretion of your surgeon and anesthesiologist your surgery may need to be rescheduled.    __X__ 1. Do not eat anything after midnight the night before your    procedure.  No gum chewing or hard candies.  You may drink clear   liquids up to 2 hours before you are scheduled to arrive at the   hospital for your procedure. Do not drink clear liquids within 2   hours of scheduled arrival to the hospital as this may lead to your   procedure being delayed or rescheduled.       Clear liquids include:   Water or Apple juice without pulp   Clear carbohydrate beverage such as Clearfast or Gatorade   Black coffee or Clear Tea (no milk, no creamer, do not add anything   to the coffee or tea)    Diabetics should only drink water   __X__ 2. No Alcohol for 24 hours before or after surgery.   ____ 3. Bring all medications with you on the day of surgery if instructed.    __X__ 4. Notify your doctor if there is any change in your medical condition     (cold, fever, infections).             __X___5. No smoking within 24 hours of your surgery.     Do not wear jewelry, make-up, hairpins, clips or nail polish.  Do not wear lotions, powders, or perfumes.   Do not shave 48 hours prior to surgery. Men may shave face and neck.  Do not bring valuables to the hospital.    [facility] is not responsible for any belongings or valuables.               Contacts, dentures or bridgework may not be worn into surgery.  Leave your suitcase in the car. After surgery it may be brought to your room.  For patients admitted to the hospital, discharge time is determined by your                treatment  team.   Patients discharged the day of surgery will not be allowed to drive home.   Please read over the following fact sheets that you were given:   MRSA Information   __X__ Take these medicines the morning of surgery with A SIP OF WATER:    1. TAKE PANTOPRAZOLE AT BEDTIME AND AGAIN THE MORNING OF SURGERY  2.   3.   4.  5.  6.  ____ Fleet Enema (as directed)   __X__ Use CHG Soap/SAGE wipes as directed  ____ Use inhalers on the day of surgery  ____ Stop metformin 2 days prior to surgery    ____ Take 1/2 of usual insulin dose the night before surgery and none on the morning of surgery.   ____ Stop Coumadin/Plavix/aspirin on   __X__ Stop Anti-inflammatories such as Advil, Aleve, Ibuprofen, Motrin, Naproxen, Naprosyn, Goodies,powder, or aspirin products.  OK to take Tylenol.   __X__ Stop supplements, Vitamin E, Fish Oil until after surgery.  STOP COSAMIN, MELATONIN  ____ Bring C-Pap to the hospital.

## 2017-01-04 NOTE — H&P (Signed)
Brandy Orr is a 83 y.o. female here for followup of right adnexal mass.  f/u of 11cm right pelvic mass, simple, unchanged since 2016 on prior CT scan.  Patient had CT scan for history of nausea vomiting in  May and right lower quadrant pain. Prior hysterectomy.  At her last visit, we discussed that this is very likely not malignant, that we could follow her with an ultrasound in 12 months.  Her Ca1 25 was normal.  However, she does notice a right lower quadrant fullness, that is worse when she lies on her right side, and is sometimes painful.  After thorough discussion with her family and thinking about this for several weeks, she has decided that she would like to remove this cyst.  History of hysterectomy for fibroids in 1974. 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  She did have an IVC filter placed when she was unable to be anticoagulated, it sounds like. However, her risk for DVT is otherwise low during a low risk surgery.   Past Medical History:  has a past medical history of Ankle fracture (2002), Basal cell carcinoma, History of chicken pox, Lumbago, Osteopenia, Osteopenia, Other and unspecified hyperlipidemia, and Tendinitis.  Past Surgical History:  has a past surgical history that includes Cataract extraction; Tonsillectomy; egd (03/23/2014); Hysterectomy (1974); and egd (07/12/2014). Family History: family history includes Heart disease in her father; Leukemia in her mother; Prostate cancer in her father. Social History:  reports that she has never smoked. She has never used smokeless tobacco. She reports that she does not drink alcohol or use drugs. OB/GYN History:  OB History   None     Allergies: is allergic to hydrocodone and oxycodone. Medications:  Current Outpatient Medications:  .  biotin 1 mg Cap, Take by mouth., Disp: , Rfl:  .  calcium carbonate-vitamin D3 (CALTRATE 600+D) 600 mg(1,500mg ) -200 unit  tablet, Take 1 tablet by mouth 2 (two) times daily with meals., Disp: , Rfl:  .  COSAMIN DS, WITH MANGANESE, 500-400-2-0.33 mg Cap, Take 1 tablet by mouth nightly., Disp: , Rfl: 0 .  LASTACAFT 0.25 % ophthalmic solution, INT 1 GTT in OU D for 7 Days PRF allergies, Disp: , Rfl: 1 .  loperamide (IMODIUM A-D) 2 mg tablet, Take by mouth., Disp: , Rfl:  .  mirabegron (MYRBETRIQ) 50 mg ER tablet, Take 1 tablet (50 mg total) by mouth once daily, Disp: 30 tablet, Rfl: 3 .  multivitamin-Ca-iron-minerals Tab, Take by mouth., Disp: , Rfl:  .  ondansetron (ZOFRAN-ODT) 4 MG disintegrating tablet, Take by mouth., Disp: , Rfl:  .  pantoprazole (PROTONIX) 40 MG DR tablet, Take 1 tablet (40 mg total) by mouth once daily., Disp: 90 tablet, Rfl: 3 .  traMADol-acetaminophen (ULTRACET) 37.5-325 mg tablet, TAKE 1 TABLET BY MOUTH EVERY 8 HOURS AS NEEDED FOR PAIN, Disp: 90 tablet, Rfl: 5  Review of Systems: No SOB, no palpitations or chest pain, no new lower extremity edema, no nausea or vomiting or bowel or bladder complaints. See HPI for gyn specific ROS.    Exam:      Vitals:   01/03/17 1038  BP: 120/70   Body mass index is 26.44 kg/m.  General: Well-developed, well-nourished female in no acute distress Body mass index is 26.44 kg/m. Skin: No rashes, ulcers or skin lesions noted. No excessive hirsutism or acne noted.  CV: RRR Pulm: CTAB Neurological: Appears alert and oriented and is a good historian. No  gross abnormalities are noted. Psychological: Normal affect and mood. No signs of anxiety or depression noted.  Pelvic exam: Pelvic:              External genitalia: vulva and labia without lesions, Tanner stage 5             Urethra: no prolapse             Vagina: normal physiologic d/c             cuff: no lesions, no cervical motion tenderness               Uterus: surgically absent                    Adnexa: +fullness in right adnexal area,  minimally-tender               Rectovaginal:  external exam normal     Impression:   The encounter diagnosis was Adnexal mass.    Plan:   -Right ovarian mass: We will plan for bilateral oophorectomy with removal of the cyst through a laparoscopic route.  Consents signed today  Patient returns for a preoperative discussion regarding her plans to proceed with surgical treatment of her adexal mass by lap BSO procedure. The patient and I discussed the technical aspects of the procedure including the potential for risks and complications. These include but are not limited to the risk of infection requiring post-operative antibiotics or further procedures. We talked about the risk of injury to adjacent organs including bladder, bowel, ureter, blood vessels or nerves. We talked about the need to convert to an open incision. We talked about the possible need for blood transfusion. We talked aboutpostop complications such asthromboembolic or cardiopulmonary complications. All of her questions were answered.  Her preoperative exam was completed and the appropriate consents were signed. She is scheduled to undergo this procedure in the near future.  Specific Peri-operative Considerations:  - Consent: obtained today - Labs: CBC, CMP preoperatively - Studies: EKG, CXR preoperatively - Bowel Preparation: None required - Abx:  None indicatedg - VTE ppx: SCDs perioperatively - Glucose Protocol: n/a - Beta-blockade: n/a

## 2017-01-10 ENCOUNTER — Ambulatory Visit: Payer: Medicare Other | Admitting: Anesthesiology

## 2017-01-10 ENCOUNTER — Encounter: Payer: Self-pay | Admitting: *Deleted

## 2017-01-10 ENCOUNTER — Encounter
Admission: RE | Disposition: A | Discharge status: Home / Self Care | Payer: Self-pay | Source: Ambulatory Visit | Attending: Obstetrics and Gynecology

## 2017-01-10 ENCOUNTER — Ambulatory Visit
Admission: RE | Admit: 2017-01-10 | Discharge: 2017-01-10 | Disposition: A | Discharge status: Home / Self Care | Payer: Medicare Other | Source: Ambulatory Visit | Attending: Obstetrics and Gynecology | Admitting: Obstetrics and Gynecology

## 2017-01-10 DIAGNOSIS — N83321 Acquired atrophy of right fallopian tube: Secondary | ICD-10-CM | POA: Insufficient documentation

## 2017-01-10 DIAGNOSIS — Z79899 Other long term (current) drug therapy: Secondary | ICD-10-CM | POA: Insufficient documentation

## 2017-01-10 DIAGNOSIS — N83201 Unspecified ovarian cyst, right side: Secondary | ICD-10-CM | POA: Diagnosis present

## 2017-01-10 DIAGNOSIS — M8580 Other specified disorders of bone density and structure, unspecified site: Secondary | ICD-10-CM | POA: Diagnosis not present

## 2017-01-10 DIAGNOSIS — Z85828 Personal history of other malignant neoplasm of skin: Secondary | ICD-10-CM | POA: Insufficient documentation

## 2017-01-10 DIAGNOSIS — Z8711 Personal history of peptic ulcer disease: Secondary | ICD-10-CM | POA: Diagnosis not present

## 2017-01-10 DIAGNOSIS — E785 Hyperlipidemia, unspecified: Secondary | ICD-10-CM | POA: Diagnosis not present

## 2017-01-10 DIAGNOSIS — N83291 Other ovarian cyst, right side: Secondary | ICD-10-CM | POA: Insufficient documentation

## 2017-01-10 DIAGNOSIS — R102 Pelvic and perineal pain: Secondary | ICD-10-CM | POA: Diagnosis not present

## 2017-01-10 HISTORY — PX: LAPAROSCOPIC BILATERAL SALPINGO OOPHERECTOMY: SHX5890

## 2017-01-10 LAB — ABO/RH: ABO/RH(D): O POS

## 2017-01-10 SURGERY — SALPINGO-OOPHORECTOMY, BILATERAL, LAPAROSCOPIC
Anesthesia: General | Laterality: Bilateral | Wound class: Clean Contaminated

## 2017-01-10 MED ORDER — LACTATED RINGERS IV SOLN
INTRAVENOUS | Status: DC
  Administered 2017-01-10 (×3): via INTRAVENOUS

## 2017-01-10 MED ORDER — ROCURONIUM BROMIDE 100 MG/10ML IV SOLN
INTRAVENOUS | Status: DC | PRN
  Administered 2017-01-10: 40 mg via INTRAVENOUS

## 2017-01-10 MED ORDER — GLYCOPYRROLATE 0.2 MG/ML IJ SOLN
INTRAMUSCULAR | Status: AC
  Filled 2017-01-10: qty 1

## 2017-01-10 MED ORDER — DEXAMETHASONE SODIUM PHOSPHATE 10 MG/ML IJ SOLN
INTRAMUSCULAR | Status: DC | PRN
  Administered 2017-01-10: 10 mg via INTRAVENOUS

## 2017-01-10 MED ORDER — ACETAMINOPHEN 10 MG/ML IV SOLN
INTRAVENOUS | Status: DC | PRN
  Administered 2017-01-10: 1000 mg via INTRAVENOUS

## 2017-01-10 MED ORDER — NEOSTIGMINE METHYLSULFATE 10 MG/10ML IV SOLN
INTRAVENOUS | Status: AC
  Filled 2017-01-10: qty 1

## 2017-01-10 MED ORDER — KETOROLAC TROMETHAMINE 30 MG/ML IJ SOLN
INTRAMUSCULAR | Status: DC | PRN
  Administered 2017-01-10: 15 mg via INTRAVENOUS

## 2017-01-10 MED ORDER — NEOSTIGMINE METHYLSULFATE 10 MG/10ML IV SOLN
INTRAVENOUS | Status: DC | PRN
  Administered 2017-01-10: 5 mg via INTRAVENOUS

## 2017-01-10 MED ORDER — PROPOFOL 10 MG/ML IV BOLUS
INTRAVENOUS | Status: AC
  Filled 2017-01-10: qty 20

## 2017-01-10 MED ORDER — FENTANYL CITRATE (PF) 100 MCG/2ML IJ SOLN
INTRAMUSCULAR | Status: AC
  Filled 2017-01-10: qty 2

## 2017-01-10 MED ORDER — FENTANYL CITRATE (PF) 100 MCG/2ML IJ SOLN
INTRAMUSCULAR | Status: DC | PRN
  Administered 2017-01-10 (×3): 50 ug via INTRAVENOUS

## 2017-01-10 MED ORDER — BUPIVACAINE HCL (PF) 0.5 % IJ SOLN
INTRAMUSCULAR | Status: AC
  Filled 2017-01-10: qty 30

## 2017-01-10 MED ORDER — GLYCOPYRROLATE 0.2 MG/ML IJ SOLN
INTRAMUSCULAR | Status: DC | PRN
  Administered 2017-01-10: .8 mg via INTRAVENOUS

## 2017-01-10 MED ORDER — ACETAMINOPHEN 500 MG PO TABS: 1000.0000 mg | ORAL_TABLET | Freq: Four times a day (QID) | ORAL | 0 refills | Status: AC

## 2017-01-10 MED ORDER — KETOROLAC TROMETHAMINE 30 MG/ML IJ SOLN
INTRAMUSCULAR | Status: AC
  Filled 2017-01-10: qty 1

## 2017-01-10 MED ORDER — ACETAMINOPHEN 10 MG/ML IV SOLN
INTRAVENOUS | Status: AC
  Filled 2017-01-10: qty 100

## 2017-01-10 MED ORDER — DEXAMETHASONE SODIUM PHOSPHATE 10 MG/ML IJ SOLN
INTRAMUSCULAR | Status: AC
  Filled 2017-01-10: qty 1

## 2017-01-10 MED ORDER — LIDOCAINE HCL (CARDIAC) 20 MG/ML IV SOLN
INTRAVENOUS | Status: DC | PRN
  Administered 2017-01-10: 50 mg via INTRAVENOUS

## 2017-01-10 MED ORDER — ONDANSETRON HCL 4 MG/2ML IJ SOLN
INTRAMUSCULAR | Status: DC | PRN
  Administered 2017-01-10: 4 mg via INTRAVENOUS

## 2017-01-10 MED ORDER — PROPOFOL 10 MG/ML IV BOLUS
INTRAVENOUS | Status: DC | PRN
  Administered 2017-01-10: 120 mg via INTRAVENOUS

## 2017-01-10 MED ORDER — DOCUSATE SODIUM 100 MG PO CAPS: 100.0000 mg | ORAL_CAPSULE | Freq: Two times a day (BID) | ORAL | 0 refills | Status: DC

## 2017-01-10 MED ORDER — ONDANSETRON HCL 4 MG/2ML IJ SOLN
INTRAMUSCULAR | Status: AC
  Filled 2017-01-10: qty 2

## 2017-01-10 MED ORDER — METHYLENE BLUE 0.5 % INJ SOLN
INTRAVENOUS | Status: AC
  Filled 2017-01-10: qty 10

## 2017-01-10 SURGICAL SUPPLY — 44 items
BAG URINE DRAINAGE (UROLOGICAL SUPPLIES) ×3 IMPLANT
BLADE SURG SZ11 CARB STEEL (BLADE) ×3 IMPLANT
CATH FOLEY 2WAY  5CC 16FR (CATHETERS) ×2
CATH ROBINSON RED A/P 16FR (CATHETERS) ×3 IMPLANT
CATH URTH 16FR FL 2W BLN LF (CATHETERS) ×1 IMPLANT
CHLORAPREP W/TINT 26ML (MISCELLANEOUS) ×3 IMPLANT
CLOSURE WOUND 1/4X4 (GAUZE/BANDAGES/DRESSINGS) ×1
DERMABOND ADVANCED (GAUZE/BANDAGES/DRESSINGS) ×2
DERMABOND ADVANCED .7 DNX12 (GAUZE/BANDAGES/DRESSINGS) ×1 IMPLANT
DRSG TEGADERM 2-3/8X2-3/4 SM (GAUZE/BANDAGES/DRESSINGS) ×9 IMPLANT
GAUZE SPONGE NON-WVN 2X2 STRL (MISCELLANEOUS) ×2 IMPLANT
GLOVE BIO SURGEON STRL SZ7 (GLOVE) ×6 IMPLANT
GLOVE INDICATOR 7.5 STRL GRN (GLOVE) ×3 IMPLANT
GOWN STRL REUS W/ TWL LRG LVL3 (GOWN DISPOSABLE) ×2 IMPLANT
GOWN STRL REUS W/TWL LRG LVL3 (GOWN DISPOSABLE) ×4
GRASPER SUT TROCAR 14GX15 (MISCELLANEOUS) ×3 IMPLANT
IRRIGATION STRYKERFLOW (MISCELLANEOUS) ×2 IMPLANT
IRRIGATOR STRYKERFLOW (MISCELLANEOUS) ×6
IV NS 1000ML (IV SOLUTION) ×2
IV NS 1000ML BAXH (IV SOLUTION) ×1 IMPLANT
KIT PINK PAD W/HEAD ARE REST (MISCELLANEOUS) ×3
KIT PINK PAD W/HEAD ARM REST (MISCELLANEOUS) ×1 IMPLANT
KIT RM TURNOVER CYSTO AR (KITS) ×3 IMPLANT
LABEL OR SOLS (LABEL) ×3 IMPLANT
LIGASURE VESSEL 5MM BLUNT TIP (ELECTROSURGICAL) ×3 IMPLANT
NS IRRIG 500ML POUR BTL (IV SOLUTION) ×3 IMPLANT
PACK GYN LAPAROSCOPIC (MISCELLANEOUS) ×3 IMPLANT
PAD OB MATERNITY 4.3X12.25 (PERSONAL CARE ITEMS) ×3 IMPLANT
PAD PREP 24X41 OB/GYN DISP (PERSONAL CARE ITEMS) ×3 IMPLANT
POUCH SPECIMEN RETRIEVAL 10MM (ENDOMECHANICALS) ×3 IMPLANT
SCISSORS METZENBAUM CVD 33 (INSTRUMENTS) ×3 IMPLANT
SLEEVE ENDOPATH XCEL 5M (ENDOMECHANICALS) ×3 IMPLANT
SLEEVE PROTECTION STRL DISP (MISCELLANEOUS) ×3 IMPLANT
SPONGE VERSALON 2X2 STRL (MISCELLANEOUS) ×4
STRIP CLOSURE SKIN 1/4X4 (GAUZE/BANDAGES/DRESSINGS) ×2 IMPLANT
SUT MNCRL AB 4-0 PS2 18 (SUTURE) ×3 IMPLANT
SUT VIC AB 2-0 UR6 27 (SUTURE) ×6 IMPLANT
SUT VIC AB 4-0 SH 27 (SUTURE) ×2
SUT VIC AB 4-0 SH 27XANBCTRL (SUTURE) ×1 IMPLANT
SWABSTK COMLB BENZOIN TINCTURE (MISCELLANEOUS) ×3 IMPLANT
SYR 50ML LL SCALE MARK (SYRINGE) ×3 IMPLANT
TROCAR ENDO BLADELESS 11MM (ENDOMECHANICALS) ×3 IMPLANT
TROCAR XCEL NON-BLD 5MMX100MML (ENDOMECHANICALS) ×3 IMPLANT
TUBING INSUFFLATION (TUBING) ×3 IMPLANT

## 2017-01-10 NOTE — Anesthesia Preprocedure Evaluation (Signed)
Anesthesia Evaluation  Patient identified by MRN, date of birth, ID band Patient awake    Reviewed: Allergy & Precautions, NPO status   Airway Mallampati: II       Dental  (+) Upper Dentures, Lower Dentures   Pulmonary neg pulmonary ROS,    breath sounds clear to auscultation       Cardiovascular Exercise Tolerance: Good  Rhythm:Regular Rate:Normal     Neuro/Psych negative neurological ROS  negative psych ROS   GI/Hepatic Neg liver ROS, PUD,   Endo/Other  negative endocrine ROS  Renal/GU negative Renal ROS     Musculoskeletal   Abdominal Normal abdominal exam  (+)   Peds negative pediatric ROS (+)  Hematology negative hematology ROS (+)   Anesthesia Other Findings   Reproductive/Obstetrics                             Anesthesia Physical Anesthesia Plan  ASA: II  Anesthesia Plan: General   Post-op Pain Management:    Induction: Intravenous  PONV Risk Score and Plan: Ondansetron  Airway Management Planned: Natural Airway and Nasal Cannula  Additional Equipment:   Intra-op Plan:   Post-operative Plan: Extubation in OR  Informed Consent: I have reviewed the patients History and Physical, chart, labs and discussed the procedure including the risks, benefits and alternatives for the proposed anesthesia with the patient or authorized representative who has indicated his/her understanding and acceptance.     Plan Discussed with: CRNA  Anesthesia Plan Comments:         Anesthesia Quick Evaluation

## 2017-01-10 NOTE — Interval H&P Note (Signed)
History and Physical Interval Note:  01/10/2017 1:12 PM  Brandy Orr  has presented today for surgery, with the diagnosis of Right Adnexal Mass  The various methods of treatment have been discussed with the patient and family. After consideration of risks, benefits and other options for treatment, the patient has consented to  Procedure(s): LAPAROSCOPIC BILATERAL SALPINGO OOPHORECTOMY (Bilateral) as a surgical intervention .  The patient's history has been reviewed, patient examined, no change in status, stable for surgery.  I have reviewed the patient's chart and labs.  Questions were answered to the patient's satisfaction.     Benjaman Kindler

## 2017-01-10 NOTE — Transfer of Care (Signed)
Immediate Anesthesia Transfer of Care Note  Patient: Brandy Orr  Procedure(s) Performed: Procedure(s): LAPAROSCOPIC BILATERAL SALPINGO OOPHORECTOMY (Bilateral)  Patient Location: PACU  Anesthesia Type:General  Level of Consciousness: sedated  Airway & Oxygen Therapy: Patient Spontanous Breathing and Patient connected to face mask oxygen  Post-op Assessment: Report given to RN and Post -op Vital signs reviewed and stable  Post vital signs: Reviewed and stable  Last Vitals:  Vitals:   01/10/17 1005 01/10/17 1547  BP: (!) 144/66 (!) 104/59  Pulse: 82 83  Resp: 16 14  Temp: 36.5 C (!) 36 C  SpO2: 12% 16%    Complications: No apparent anesthesia complications

## 2017-01-10 NOTE — Op Note (Signed)
Felecia Jan PROCEDURE DATE: 01/10/2017  PREOPERATIVE DIAGNOSIS: Persistent right ovarian cyst with pelvic pain POSTOPERATIVE DIAGNOSIS: same PROCEDURE: Diagnostic laparoscopy, Lysis of adhesions; right oopherectomy SURGEON:  Dr. Benjaman Kindler ASSISTANT: CST ANESTHESIOLOGIST: Emmie Niemann, MD Anesthesiologist: Emmie Niemann, MD; [place] [facility], Jane Canary, MD CRNA: Doreen Salvage, CRNA; Lesle Reek, CRNA  INDICATIONS: 83 y.o. F with 11cm ovarian cyst with pelvic pain desiring surgical evaluation.   Please see preoperative notes for further details.  Risks of surgery were discussed with the patient including but not limited to: bleeding which may require transfusion or reoperation; infection which may require antibiotics; injury to bowel, bladder, ureters or other surrounding organs; need for additional procedures including laparotomy; thromboembolic phenomenon, incisional problems and other postoperative/anesthesia complications. Written informed consent was obtained.    FINDINGS:  Absent uterus, left ovary adhesed behind bowel, +bowel adhesions at the flexion, enlarged simple cyst on right ovary into the retroperitoneal space. No other abdominal/pelvic abnormality.  Normal upper abdomen.  ANESTHESIA:    General INTRAVENOUS FLUIDS: 1200 ml ESTIMATED BLOOD LOSS: 20 ml URINE OUTPUT: 400 ml SPECIMENS: Right ovary and tube, pelvic washings  COMPLICATIONS: None immediate  PROCEDURE IN DETAIL:  The patient had sequential compression devices applied to her lower extremities while in the preoperative area.  She was then taken to the operating room where general anesthesia was administered and was found to be adequate.  She was placed in the dorsal lithotomy position, and was prepped and draped in a sterile manner.  A Foley catheter was inserted into her bladder and attached to constant drainage and a uterine manipulator was then advanced into the uterus .  After an adequate timeout was  performed, attention was turned to the abdomen where an umbilical incision was made with the scalpel.  The Optiview 5-mm trocar and sleeve were then advanced without difficulty with the laparoscope under direct visualization into the abdomen.  The abdomen was then insufflated with carbon dioxide gas and adequate pneumoperitoneum was obtained.   A detailed survey of the patient's pelvis and abdomen revealed the findings as mentioned above.  An additional 64mm trocar was placed in the right lower quadrant under direct visualization, and at 6mm trocar was placed in the opposite quadrant under direct visualization.   Evaluation of the pelvic sidewalls to observe the course of the ureters was undertaken, assuring the ureter was outside the operative field.   Pelvic washings were taken.   The Right adnexa was dissected away from the pelvic sidewall, and the ovary placed over an endocatch bag. Using a Ligasure electrocautery device, the IP ligament was skeletonized, triply clamped, cauterized and transected to remove the ovary in its entirety, being careful not to spill the contents of the ovarian cyst.  The adhesions at the left sigmoid flexion were taken down sharply, and the left ovary was not visualized. The decision was made to leave this ovary in place, as my suspicion of cancer is very small.  The operative site was surveyed, and it was found to be hemostatic.  No intraoperative injury to surrounding organs was noted. The fascia of the 10 mm trocar site was closed with 2-0 Vicryl.   Pictures were taken of the quadrants and pelvis. The abdomen was desufflated and all instruments were then removed from the patient's abdomen. The uterine manipulator was removed without complications.  All incisions were closed with 4-0 Vicryl and Dermabond.   The patient tolerated the procedures well.  All instruments, needles, and sponge counts were correct x 2.  The patient was taken to the recovery room in stable  condition.

## 2017-01-10 NOTE — Anesthesia Postprocedure Evaluation (Signed)
Anesthesia Post Note  Patient: Brandy Orr  Procedure(s) Performed: LAPAROSCOPIC BILATERAL SALPINGO OOPHORECTOMY (Bilateral )  Patient location during evaluation: PACU Anesthesia Type: General Level of consciousness: awake and alert and oriented Pain management: pain level controlled Vital Signs Assessment: post-procedure vital signs reviewed and stable Respiratory status: spontaneous breathing, nonlabored ventilation and respiratory function stable Cardiovascular status: blood pressure returned to baseline and stable Postop Assessment: no signs of nausea or vomiting Anesthetic complications: no     Last Vitals:  Vitals:   01/10/17 1602 01/10/17 1617  BP: 117/69 125/81  Pulse: 82 78  Resp: 12 13  Temp:  (!) 36.3 C  SpO2: 95% 99%    Last Pain:  Vitals:   01/10/17 1617  TempSrc:   PainSc: 2                  Danelia Snodgrass

## 2017-01-10 NOTE — Progress Notes (Signed)
Peri pad dry and intact.

## 2017-01-10 NOTE — Discharge Instructions (Signed)
Laparoscopic Ovarian Surgery Discharge Instructions  RISKS AND COMPLICATIONS   Infection.  Bleeding.  Injury to surrounding organs.  Anesthetic side effects.   PROCEDURE   You may be given a medicine to help you relax (sedative) before the procedure. You will be given a medicine to make you sleep (general anesthetic) during the procedure.  A tube will be put down your throat to help your breath while under general anesthesia.  Several small cuts (incisions) are made in the lower abdominal area and one incision is made near the belly button.  Your abdominal area will be inflated with a safe gas (carbon dioxide). This helps give the surgeon room to operate, visualize, and helps the surgeon avoid other organs.  A thin, lighted tube (laparoscope) with a camera attached is inserted into your abdomen through the incision near the belly button. Other small instruments may also be inserted through other abdominal incisions.  The ovary is located and are removed.  After the ovary is removed, the gas is released from the abdomen.  The incisions will be closed with stitches (sutures), and Dermabond. A bandage may be placed over the incisions.  AFTER THE PROCEDURE   You will also have some mild abdominal discomfort for 3-7 days. You will be given pain medicine to ease any discomfort.  As long as there are no problems, you may be allowed to go home. Someone will need to drive you home and be with you for at least 24 hours once home.  You may have some mild discomfort in the throat. This is from the tube placed in your throat while you were sleeping.  You may experience discomfort in the shoulder area from some trapped air between the liver and diaphragm. This sensation is normal and will slowly go away on its own.  HOME CARE INSTRUCTIONS   Take all medicines as directed.  **Take the tramadol that your primary care doctor gave you for pain, and tylenol as needed. Call my office if you  need something else for pain.** I often given postoperative patients ibuprofen, but because of your ulcer I am hesitant to give you this. Let me know if you need anything.    Only take over-the-counter or prescription medicines for pain, discomfort, or fever as directed by your caregiver.  Resume daily activities as directed.    Do not drive while taking narcotics.  SEEK MEDICAL CARE IF: .  There is increasing abdominal pain.  You feel lightheaded or faint.  You have the chills.  You have an oral temperature above 102 F (38.9 C).  There is pus-like (purulent) drainage from any of the wounds.  You are unable to pass gas or have a bowel movement.  You feel sick to your stomach (nauseous) or throw up (vomit) and can't control it with your medicines.  MAKE SURE YOU:   Understand these instructions.  Will watch your condition.  Will get help right away if you are not doing well or get worse.  ExitCare Patient Information 2013 [place].     Here is a helpful article from the website [employer], regarding constipation  Here are reasons why constipation occurs after surgery: 1) During the operation and in the recovery room, most people are given opioid pain medication, primarily through an IV, to treat moderate or severe pain. Intravenous opioids include morphine, Dilaudid and fentanyl. After surgery, patients are often prescribed opioid pain medication to take by mouth at home, including codeine, Vicodin, Norco, and Percocet. All of  these medications cause constipation by slowing down the movement of your intestine. 2) Changes in your diet before surgery can be another culprit. It is common to get specific instructions to change how you normally eat or drink before your surgery, like only having liquids the day before or not having anything to eat or drink after midnight the night before surgery. For this reason, temporary dehydration may occur. This, along with not  eating or only having liquids, means that you are getting less fiber than usual. Both these factors contribute to constipation. 3) Changes in your diet after surgery can also contribute to the problem. Although many people dont have dietary restrictions after operations, being under anesthesia can make you lose your appetite for several hours and maybe even days. Some people can even have nausea or vomiting. Not eating or drinking normally means that you are not getting enough fiber and you can get dehydrated, both leading to constipation. 4) Lying in a bed more than usual--which happens before, during and after surgery--combined with the medications and diet changes, all work together to slow down your colon and make your poop turn to rock.  No one likes to be constipated.  Lets face it, its not a pleasant feeling when you dont poop for days, then strain on the toilet to finally pass something large enough to cause damage. An ounce of prevention is worth a pound of cure, so: 1. Assume you will be constipated. 2. Plan and prepare accordingly. Post-surgery is one of those unique situations where the temporary use of laxatives can make a world of difference. Always consult with your doctor, and recognize that if you wait several days after surgery to take a laxative, the constipation might be too severe for these over-the-counter options. It is always important to discuss all medications you plan on taking with your doctor. Ask your doctor if you can start the laxative immediately after surgery. *  Here are go-to post-surgery laxatives: Senna: Senna is an herb that acts as a stimulant laxative, meaning it increases the activity of the intestine to cause you to have a bowel movement. It comes in many forms, but senna pills are easy to take and are sold over the counter at almost all pharmacies. Since opioid pain medications slow down the activity of the intestine, it makes sense to take a medication to  help reverse that side effect. Long-term use of a stimulant laxative is not a good idea since it can make your colon lazy and not function properly; however, temporary use immediately after surgery is acceptable. In general, if you are able to eat a normal diet, taking senna soon after surgery works the best. Senna usually works within hours to produce a bowel movement, but this is less predictable when you are taking different medications after surgery. Try not to wait several days to start taking senna, as often it is too late by then. Just like with all medications or supplements, check with your doctor before starting new treatment.   Magnesium: Magnesium is an important mineral that our body needs. We get magnesium from some foods that we eat, especially foods that are high in fiber such as broccoli, almonds and whole grains. There are also magnesium-based medications used to treat constipation including milk of magnesia (magnesium hydroxide), magnesium citrate and magnesium oxide. They work by drawing water into the intestine, putting it into the class of osmotic laxatives. Magnesium products in low doses appear to be safe, but if taken in  very large doses, can lead to problems such as irregular heartbeat, low blood pressure and even death. It can also affect other medications you might be taking, therefore it is important to discuss using magnesium with your physician and pharmacist before initiating therapy. Most over-the-counter magnesium laxatives work very well to help with the constipation related to surgery, but sometimes they work too well and lead to diarrhea. Make sure you are somewhere with easy access to a bathroom, just in case.   Bisacodyl: Bisacodyl (generic name) is sold under brand names such as Dulcolax. Much like senna, it is a stimulant laxative, meaning it makes your intestines move more quickly to push out the stool. This is another good choice to start taking as soon as your  doctor says you can take a laxative after surgery. It comes in pill form and as a suppository, which is a good choice for people who cannot or are not allowed to swallow pills. Studies have shown that it works as a laxative, but like most of these medications, you should use this on a short-term basis only.   Enema: Enemas strike fear in many people, but FEAR NOT! Its nowhere near as big a deal as you may think. An enema is just a way to get some liquid into your rectum by placing a specially designed device through your anus. If you have never done one, it might seem like a painful, unpleasant, uncomfortable, complicated and lengthy procedure. But in reality, its simple, takes just a few seconds and is highly effective. The small ready-made bottles you buy at the pharmacy are much easier than the hose/large rubber container type. Those recommended positions illustrated in some instructions are generally not necessary to place the enema. Its very similar to the insertion of a tampon, requiring a slight squat. Some extra lubrication on the enemas tip (or on your anus) will make it a breeze. In certain cases, there is no substitute for a good enema. For example, if someone has not pooped for a few days, the beginning of the poop waiting to come out can become rock hard. Passing that hard stool can lead to much pain and problems like anal fissures. Inserting a little liquid to break up the rock-hard stool will help make its passage much easier. Enemas come with different liquids. Most come with saline, but there are also mineral oil options. You can also use warm water in the reusable enema containers. They all work. But since saline can sometimes be irritating, so try a mineral oil or water enema instead.  Here are commonly recommended constipation medications that do not work well for post-surgery constipation: Docusate: Docusate (generic name) most commonly referred to as Colace (brand name) is not really  a laxative, but is classified as a stool softener. Although this medication is commonly prescribed, it is not recommended for several reasons: 1) there is no good medical evidence that it works 2) even if it has an effect, which is very questionable, it is minimal and cannot combat the intestinal slowing caused by the opioid medications. Skip docusate to save money and space in your pillbox for something more effective.  PEG: Miralax (brand name) is basically a chemical called polyethylene glycol (PEG) and it has gained tremendous popularity as a laxative. This product is an osmotic laxative meaning it works by pulling water into the stool, making it softer. This is very similar to the action of natural fiber in foods and supplements. Therefore, the effect seen by  this medication is not immediate, causing a bowel movement in a day or more. Is this medication strong enough to battle the constipation related to having an operation? Maybe for some people not prone to constipation. But for most people, other laxatives are better to prevent constipation after surgery.   AMBULATORY SURGERY  DISCHARGE INSTRUCTIONS   1) The drugs that you were given will stay in your system until tomorrow so for the next 24 hours you should not:  A) Drive an automobile B) Make any legal decisions C) Drink any alcoholic beverage   2) You may resume regular meals tomorrow.  Today it is better to start with liquids and gradually work up to solid foods.  You may eat anything you prefer, but it is better to start with liquids, then soup and crackers, and gradually work up to solid foods.   3) Please notify your doctor immediately if you have any unusual bleeding, trouble breathing, redness and pain at the surgery site, drainage, fever, or pain not relieved by medication.    4) Additional Instructions:        Please contact your physician with any problems or Same Day Surgery at [phone], Monday through  Friday 6 am to 4 pm, or [place] [facility] number at [phone].

## 2017-01-10 NOTE — Anesthesia Procedure Notes (Signed)
Procedure Name: Intubation Date/Time: 01/10/2017 2:05 PM Performed by: Lesle Reek, CRNA Pre-anesthesia Checklist: Patient identified, Emergency Drugs available, Suction available, Patient being monitored and Timeout performed Patient Re-evaluated:Patient Re-evaluated prior to induction Oxygen Delivery Method: Circle system utilized Preoxygenation: Pre-oxygenation with 100% oxygen Induction Type: IV induction Laryngoscope Size: Mac and 3 Grade View: Grade I Tube type: Oral Tube size: 7.0 mm Number of attempts: 1 Placement Confirmation: ETT inserted through vocal cords under direct vision,  positive ETCO2,  CO2 detector and breath sounds checked- equal and bilateral Secured at: 21 cm Tube secured with: Tape

## 2017-01-10 NOTE — Progress Notes (Signed)
States soreness more than pain

## 2017-01-10 NOTE — Anesthesia Post-op Follow-up Note (Signed)
Anesthesia QCDR form completed.        

## 2017-01-11 ENCOUNTER — Encounter: Payer: Self-pay | Admitting: Obstetrics and Gynecology

## 2017-01-12 LAB — SURGICAL PATHOLOGY

## 2017-01-12 LAB — CYTOLOGY - NON PAP

## 2017-09-15 ENCOUNTER — Other Ambulatory Visit: Payer: Self-pay | Admitting: Internal Medicine

## 2017-09-15 DIAGNOSIS — R100 Acute abdomen: Secondary | ICD-10-CM

## 2017-10-03 ENCOUNTER — Ambulatory Visit
Admission: RE | Admit: 2017-10-03 | Discharge: 2017-10-03 | Disposition: A | Discharge status: Home / Self Care | Payer: Medicare Other | Source: Ambulatory Visit | Attending: Internal Medicine | Admitting: Internal Medicine

## 2017-10-03 DIAGNOSIS — K8020 Calculus of gallbladder without cholecystitis without obstruction: Secondary | ICD-10-CM | POA: Diagnosis not present

## 2017-10-03 DIAGNOSIS — R100 Acute abdomen: Secondary | ICD-10-CM | POA: Insufficient documentation

## 2017-10-03 DIAGNOSIS — I7090 Unspecified atherosclerosis: Secondary | ICD-10-CM | POA: Insufficient documentation

## 2017-10-03 DIAGNOSIS — M150 Primary generalized (osteo)arthritis: Secondary | ICD-10-CM | POA: Diagnosis present

## 2017-10-03 DIAGNOSIS — K5730 Diverticulosis of large intestine without perforation or abscess without bleeding: Secondary | ICD-10-CM | POA: Insufficient documentation

## 2017-10-03 DIAGNOSIS — M4856XA Collapsed vertebra, not elsewhere classified, lumbar region, initial encounter for fracture: Secondary | ICD-10-CM | POA: Insufficient documentation

## 2017-10-03 LAB — POCT I-STAT CREATININE: Creatinine, Ser: 0.9 mg/dL (ref 0.44–1.00)

## 2017-10-03 MED ORDER — IOPAMIDOL (ISOVUE-300) INJECTION 61%
100.0000 mL | Freq: Once | INTRAVENOUS | Status: AC | PRN
  Administered 2017-10-03: 100 mL via INTRAVENOUS

## 2018-03-06 ENCOUNTER — Other Ambulatory Visit: Payer: Self-pay | Admitting: Internal Medicine

## 2018-03-06 DIAGNOSIS — M889 Osteitis deformans of unspecified bone: Secondary | ICD-10-CM

## 2018-03-10 ENCOUNTER — Encounter
Admission: RE | Admit: 2018-03-10 | Discharge: 2018-03-10 | Disposition: A | Discharge status: Home / Self Care | Payer: Medicare Other | Source: Ambulatory Visit | Attending: Internal Medicine | Admitting: Internal Medicine

## 2018-03-10 DIAGNOSIS — M889 Osteitis deformans of unspecified bone: Secondary | ICD-10-CM | POA: Diagnosis not present

## 2018-03-10 MED ORDER — TECHNETIUM TC 99M MEDRONATE IV KIT: 20.0000 | PACK | Freq: Once | INTRAVENOUS | Status: DC | PRN

## 2019-06-19 IMAGING — CT CT ABD-PELV W/ CM
2 of 5 series · 16 of 46 positions shown, 18 images · IV contrast (APPLIED)
Comparison: 10/18/2016

CLINICAL DATA: Abdominal distention for 2 months

EXAM:
CT ABDOMEN AND PELVIS WITH CONTRAST
TECHNIQUE: Multidetector CT imaging of the abdomen and pelvis was performed
using the standard protocol following bolus administration of
intravenous contrast.
CONTRAST:  100mL AVU63S-TCC IOPAMIDOL (AVU63S-TCC) INJECTION 61%

[Series 2: routine abd/pel with · axial · 0.73mm/px · z∈[-651,-251]mm · 13 of 92 slices shown, 15 images]
[im 6/92  soft-tissue]
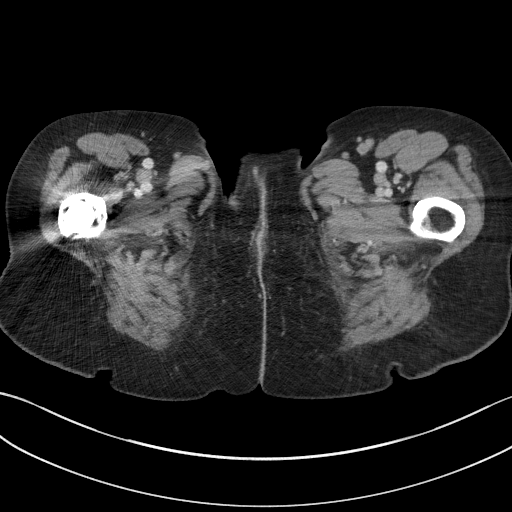
[im 6/92  bone]
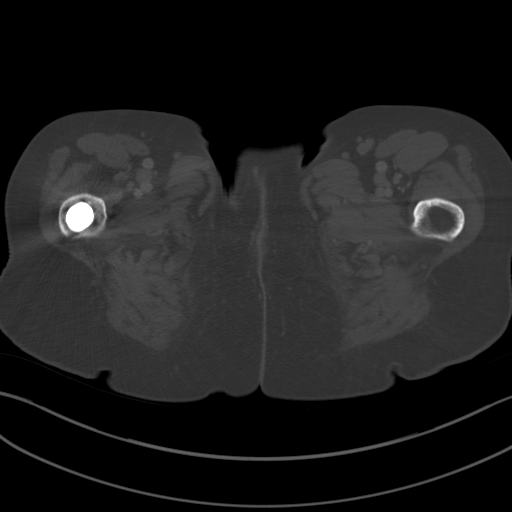
[im 11/92  soft-tissue]
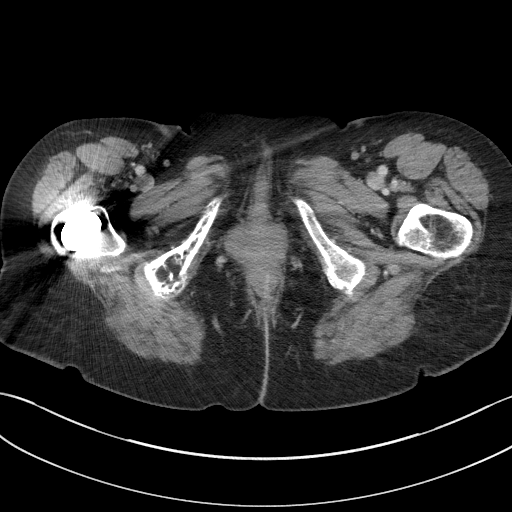
[im 21/92  soft-tissue]
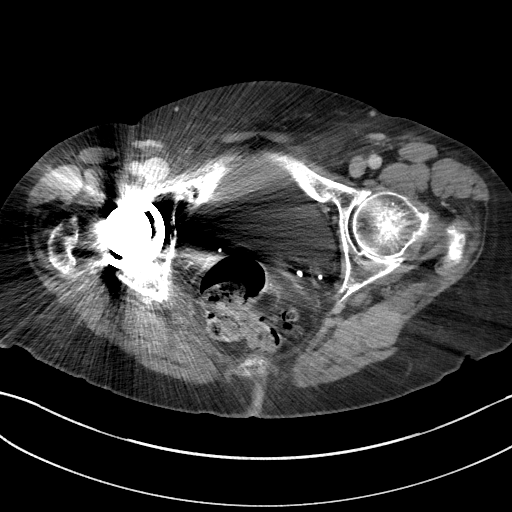
[im 26/92  soft-tissue]
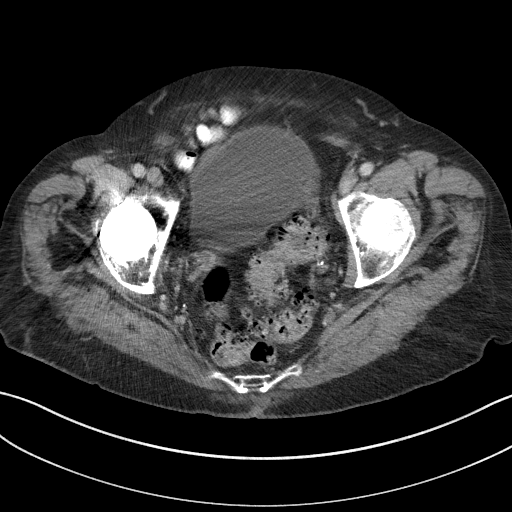
[im 31/92  soft-tissue]
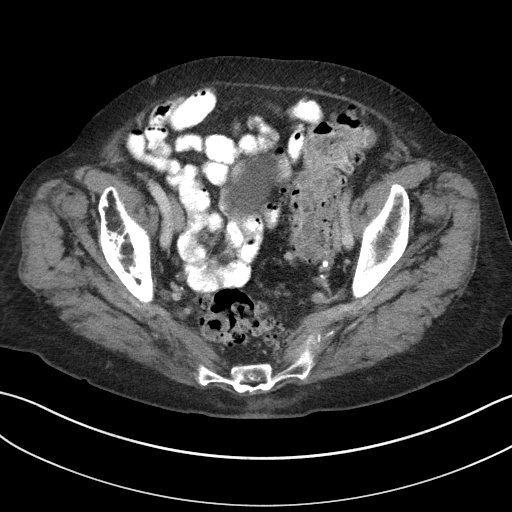
[im 41/92  soft-tissue]
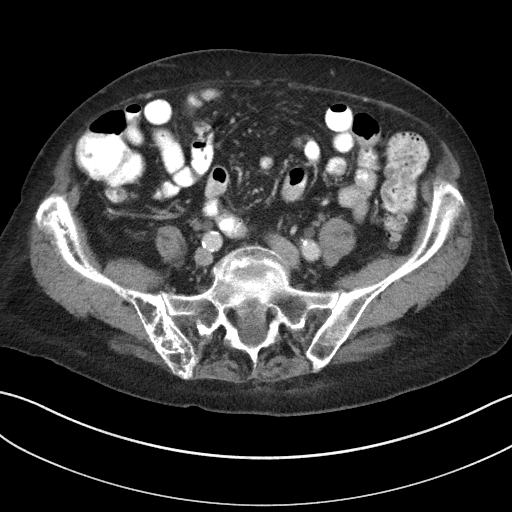
[im 46/92  soft-tissue]
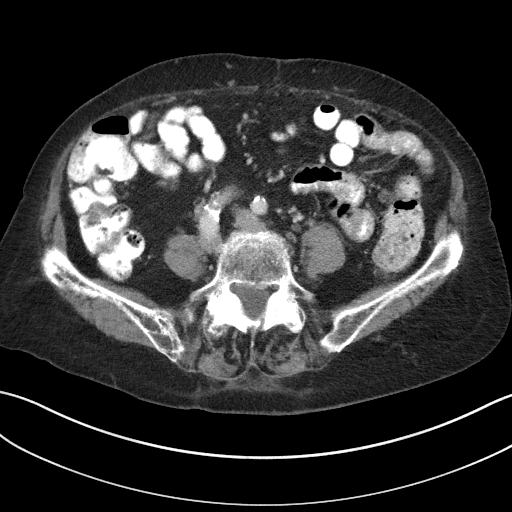
[im 51/92  soft-tissue]
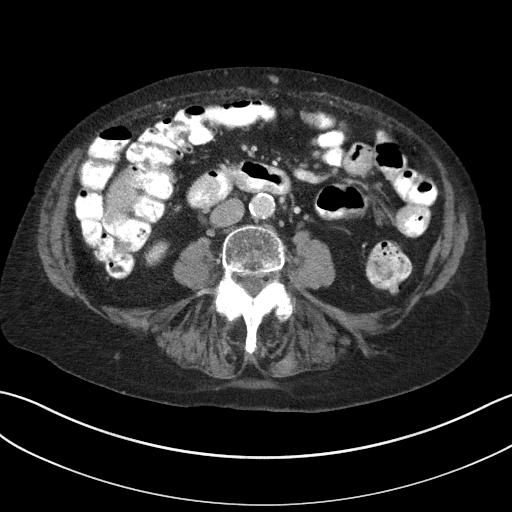
[im 61/92  soft-tissue]
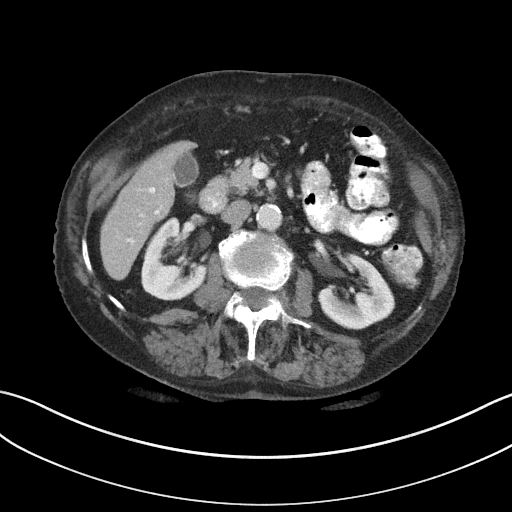
[im 61/92  bone]
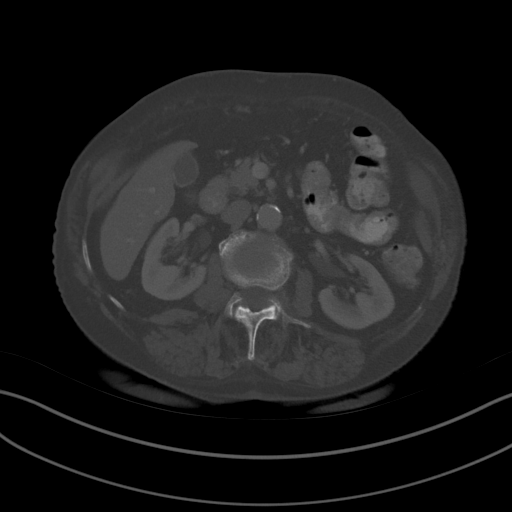
[im 66/92  soft-tissue]
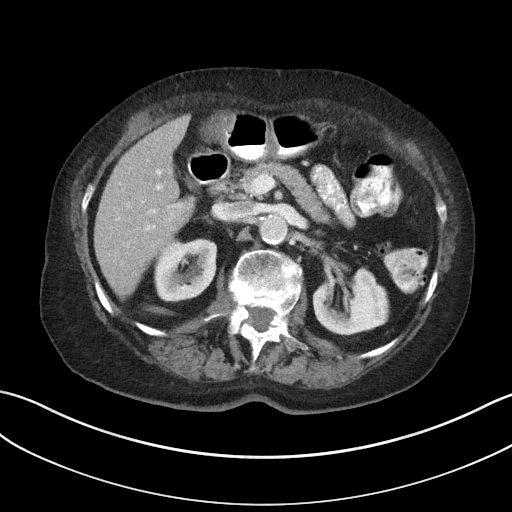
[im 71/92  soft-tissue]
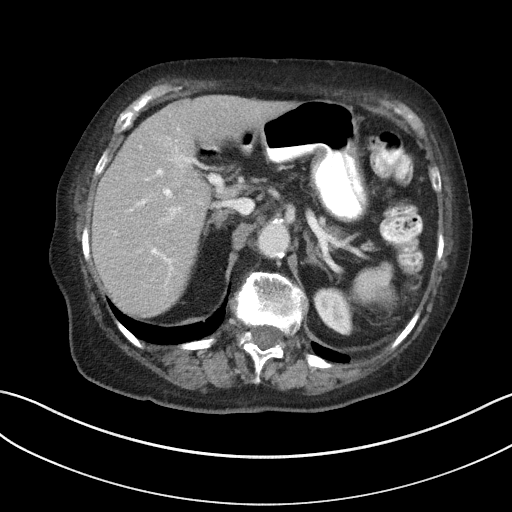
[im 81/92  soft-tissue]
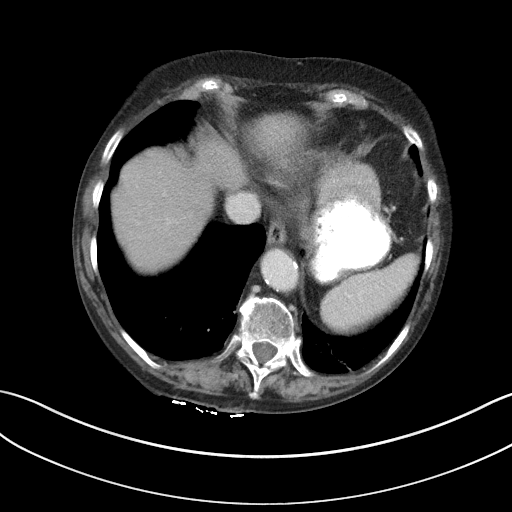
[im 86/92  soft-tissue]
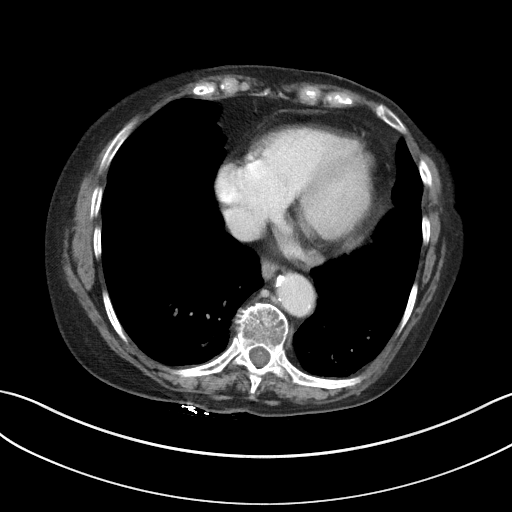

[Series 6: coronal st · coronal · 0.79mm/px · 3 of 88 slices shown]
[im 30/88  soft-tissue]
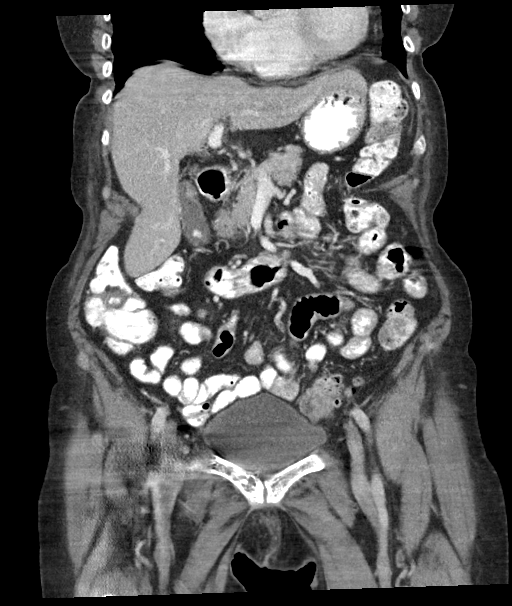
[im 39/88  soft-tissue]
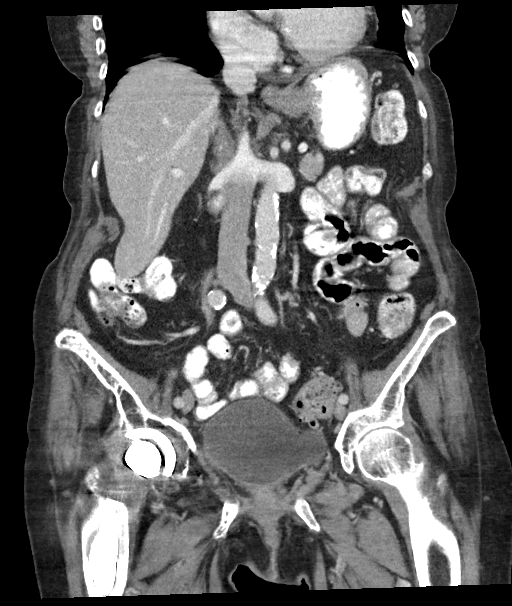
[im 49/88  soft-tissue]
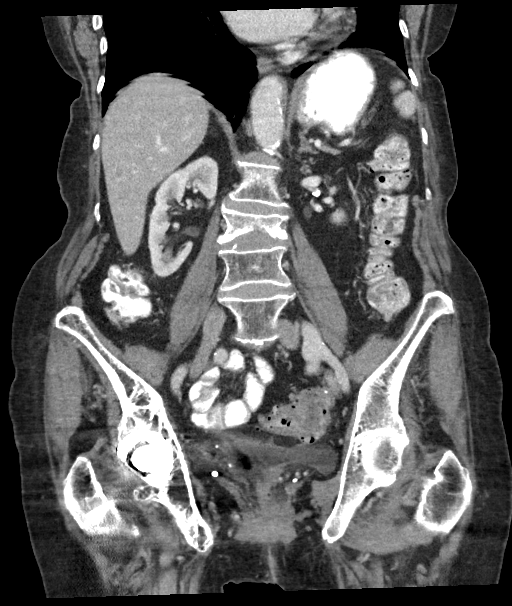

[16 of 46 positions shown; findings below may reference images not displayed]

FINDINGS: Lower chest:  Borderline heart size.  No acute finding.

Hepatobiliary: No focal liver abnormality.Layering gallstone which
is mobile when compared to positioning on prior. No acute
inflammation

Pancreas: Unremarkable.

Spleen: Multiple granulomatous type calcifications

Adrenals/Urinary Tract: Negative adrenals. No hydronephrosis or
stone. Unremarkable bladder.

Stomach/Bowel: Extensive distal colonic diverticulosis. No
obstruction or visible bowel inflammation. An appendix is not
clearly visualized. No pericecal inflammation. History of duodenal
and gastric ulcer with no periantral inflammation seen.

Vascular/Lymphatic: Atherosclerotic calcification. Mild narrowing of
the celiac and proximal SMA. No mass or adenopathy.

Reproductive:Surgically absent uterus.  Negative left adnexa.

Other: No ascites or pneumoperitoneum.

Musculoskeletal: Cortical and trabecular coarsening involving the
right hemipelvis consistent with Paget's disease. Right hip
arthroplasty. L3 inferior body fracture with moderate height loss.
Some of the fracture planes are still visible, but there is
sclerosis and probable callus.
IMPRESSION: 1. No acute finding.
2. L3 compression fracture with moderate height loss, likely
subacute.
3. Extensive distal colonic diverticulosis.
4. Atherosclerosis.
5. Cholelithiasis.
6. Paget's of the right hemipelvis.

## 2019-12-26 LAB — HM DEXA SCAN

## 2021-03-24 ENCOUNTER — Encounter: Payer: Self-pay | Admitting: Internal Medicine

## 2021-03-24 ENCOUNTER — Other Ambulatory Visit: Payer: Self-pay

## 2021-03-24 ENCOUNTER — Ambulatory Visit: Payer: Medicare PPO | Admitting: Internal Medicine

## 2021-03-24 VITALS — BP 124/72 | HR 92 | Resp 15 | Ht 66.0 in | Wt 153.8 lb

## 2021-03-24 DIAGNOSIS — N1831 Chronic kidney disease, stage 3a: Secondary | ICD-10-CM

## 2021-03-24 DIAGNOSIS — E7800 Pure hypercholesterolemia, unspecified: Secondary | ICD-10-CM

## 2021-03-24 DIAGNOSIS — I700 Atherosclerosis of aorta: Secondary | ICD-10-CM | POA: Diagnosis not present

## 2021-03-24 DIAGNOSIS — M159 Polyosteoarthritis, unspecified: Secondary | ICD-10-CM

## 2021-03-24 DIAGNOSIS — K219 Gastro-esophageal reflux disease without esophagitis: Secondary | ICD-10-CM

## 2021-03-24 MED ORDER — PANTOPRAZOLE SODIUM 40 MG PO TBEC: 40.0000 mg | DELAYED_RELEASE_TABLET | Freq: Every day | ORAL | 1 refills | Status: DC

## 2021-03-24 NOTE — Progress Notes (Signed)
HPI  Patient presents the clinic today to establish care and for management of the conditions listed below.  OA: Generalized. She takes Ultracet as prescribed with good relief of symptoms.  GERD: She is not sure what triggers this.  She takes Pantoprazole as prescribed, denies breakthrough. Upper GI from 07/2014 reviewed.  Past Medical History:  Diagnosis Date   Arthritis    Cancer ([place])    skin ca   Duodenal ulcer    Gastric ulcer     Current Outpatient Medications  Medication Sig Dispense Refill   acetaminophen (TYLENOL) 650 MG CR tablet Take 650-1,300 mg by mouth every 8 (eight) hours as needed for pain.      docusate sodium (COLACE) 100 MG capsule Take 1 capsule (100 mg total) by mouth 2 (two) times daily. To keep stools soft 30 capsule 0   pantoprazole (PROTONIX) 40 MG tablet Take 40 mg by mouth daily.   12   traMADol-acetaminophen (ULTRACET) 37.5-325 MG tablet Take 1 tablet by mouth every 8 (eight) hours as needed for moderate pain.   3   Biotin 5 MG CAPS Take 5 mg by mouth daily.     COSAMIN DS 500-400 MG CAPS Take 2 tablets by mouth daily.   0   loperamide (IMODIUM A-D) 2 MG tablet Take 1 tablet (2 mg total) by mouth 4 (four) times daily as needed for diarrhea or loose stools. 20 tablet 0   Melatonin 10 MG CAPS Take 10 mg by mouth at bedtime as needed (for sleep). (Patient not taking: Reported on 03/24/2021)     Multiple Vitamins-Minerals (CENTRUM SILVER ULTRA WOMENS PO) Take 1 tablet by mouth daily. (Patient not taking: Reported on 03/24/2021)     ondansetron (ZOFRAN ODT) 4 MG disintegrating tablet Take 1 tablet (4 mg total) by mouth every 8 (eight) hours as needed for nausea or vomiting. (Patient not taking: Reported on 12/28/2016) 20 tablet 0   No current facility-administered medications for this visit.    Allergies  Allergen Reactions   Oxycodone Nausea And Vomiting   Hydrocodone Nausea And Vomiting    Family History  Problem Relation Age of Onset   Leukemia Mother     Diabetes Mother    Heart disease Father    Diabetes Father     Social History   Socioeconomic History   Marital status: Widowed    Spouse name: Not on file   Number of children: Not on file   Years of education: Not on file   Highest education level: Not on file  Occupational History   Not on file  Tobacco Use   Smoking status: Never   Smokeless tobacco: Never  Vaping Use   Vaping Use: Never used  Substance and Sexual Activity   Alcohol use: No   Drug use: No   Sexual activity: Not on file  Other Topics Concern   Not on file  Social History Narrative   Not on file   Social Determinants of Health   Financial Resource Strain: Not on file  Food Insecurity: Not on file  Transportation Needs: Not on file  Physical Activity: Not on file  Stress: Not on file  Social Connections: Not on file  Intimate Partner Violence: Not on file    ROS:  Constitutional: Denies fever, malaise, fatigue, headache or abrupt weight changes.  HEENT: Denies eye pain, eye redness, ear pain, ringing in the ears, wax buildup, runny nose, nasal congestion, bloody nose, or sore throat. Respiratory: Denies difficulty breathing, shortness  of breath, cough or sputum production.   Cardiovascular: Denies chest pain, chest tightness, palpitations or swelling in the hands or feet.  Gastrointestinal: Denies abdominal pain, bloating, constipation, diarrhea or blood in the stool.  GU: Denies frequency, urgency, pain with urination, blood in urine, odor or discharge. Musculoskeletal: Patient reports joint pain.  Denies decrease in range of motion, difficulty with gait, muscle pain or joint swelling.  Skin: Denies redness, rashes, lesions or ulcercations.  Neurological: Denies dizziness, difficulty with memory, difficulty with speech or problems with balance and coordination.  Psych: Denies anxiety, depression, SI/HI.  No other specific complaints in a complete review of systems (except as listed in HPI  above).  PE:  BP 124/72 (BP Location: Right Arm, Patient Position: Sitting, Cuff Size: Normal)    Pulse 92    Resp 15    Ht 5\' 6"  (1.676 m)    Wt 153 lb 12.8 oz (69.8 kg)    SpO2 95%    BMI 24.82 kg/m  Wt Readings from Last 3 Encounters:  03/24/21 153 lb 12.8 oz (69.8 kg)  01/04/17 164 lb 9.6 oz (74.7 kg)  06/12/16 158 lb 9.6 oz (71.9 kg)    General: Appears her stated age, well developed, well nourished in NAD. HEENT: Head: normal shape and size; Eyes: sclera white and EOMs intact;   Cardiovascular: Normal rate and rhythm. S1,S2 noted.  No murmur, rubs or gallops noted. No JVD or BLE edema.  Pulmonary/Chest: Normal effort and positive vesicular breath sounds. No respiratory distress. No wheezes, rales or ronchi noted.  Musculoskeletal: No difficulty with gait.  Neurological: Alert and oriented.  Psychiatric: Mood and affect normal. Behavior is normal. Judgment and thought content normal.    BMET    Component Value Date/Time   NA 140 01/04/2017 0839   NA 139 03/11/2014 1411   K 3.7 01/04/2017 0839   K 3.6 03/11/2014 1411   CL 107 01/04/2017 0839   CL 103 03/11/2014 1411   CO2 25 01/04/2017 0839   CO2 29 03/11/2014 1411   GLUCOSE 97 01/04/2017 0839   GLUCOSE 79 03/11/2014 1411   BUN 16 01/04/2017 0839   BUN 15 03/11/2014 1411   CREATININE 0.90 10/03/2017 1101   CREATININE 0.85 03/11/2014 1411   CALCIUM 9.2 01/04/2017 0839   CALCIUM 9.0 03/11/2014 1411   GFRNONAA >60 01/04/2017 0839   GFRNONAA >60 03/11/2014 1411   GFRAA >60 01/04/2017 0839   GFRAA >60 03/11/2014 1411    Lipid Panel  No results found for: CHOL, TRIG, HDL, CHOLHDL, VLDL, LDLCALC  CBC    Component Value Date/Time   WBC 5.3 01/04/2017 0839   RBC 3.86 01/04/2017 0839   HGB 12.8 01/04/2017 0839   HGB 12.7 03/11/2014 1411   HCT 37.6 01/04/2017 0839   HCT 37.3 03/11/2014 1411   PLT 282 01/04/2017 0839   PLT 341 03/11/2014 1411   MCV 97.3 01/04/2017 0839   MCV 97 03/11/2014 1411   MCH 33.1  01/04/2017 0839   MCHC 34.0 01/04/2017 0839   RDW 13.3 01/04/2017 0839   RDW 13.4 03/11/2014 1411    Hgb A1C No results found for: HGBA1C   Assessment and Plan:    Webb Silversmith, NP This visit occurred during the SARS-CoV-2 public health emergency.  Safety protocols were in place, including screening questions prior to the visit, additional usage of staff PPE, and extensive cleaning of exam room while observing appropriate contact time as indicated for disinfecting solutions.

## 2021-03-26 DIAGNOSIS — M159 Polyosteoarthritis, unspecified: Secondary | ICD-10-CM | POA: Insufficient documentation

## 2021-03-26 DIAGNOSIS — I700 Atherosclerosis of aorta: Secondary | ICD-10-CM | POA: Insufficient documentation

## 2021-03-26 DIAGNOSIS — K219 Gastro-esophageal reflux disease without esophagitis: Secondary | ICD-10-CM | POA: Insufficient documentation

## 2021-03-26 NOTE — Patient Instructions (Signed)

## 2021-03-26 NOTE — Assessment & Plan Note (Signed)
Pantoprazole refilled today 

## 2021-03-26 NOTE — Assessment & Plan Note (Signed)
No need for statin therapy given her recent lipid profile and age She does take a baby Aspirin daily

## 2021-03-26 NOTE — Assessment & Plan Note (Signed)
Continue Ultracet as needed 

## 2021-05-12 ENCOUNTER — Telehealth: Payer: Self-pay

## 2021-05-12 NOTE — Telephone Encounter (Signed)
Copied from [place] [id number]. Topic: General - Other ?>> May 11, 2021  2:57 PM Camille Bal, [place] wrote: ?Reason for CRM:pt called in wants to speak with Dr Garnette Gunner abut letter she received from [facility] saying she needs to see a nurse for her insurance, when she has already been seen by Dr Garnette Gunner. Please call back ?

## 2021-05-12 NOTE — Telephone Encounter (Signed)
Pt advised.   Thanks,   -Amram Maya  

## 2021-05-12 NOTE — Telephone Encounter (Signed)
The insurance companies often want to have a RN make a house call. This is completely up to her, but not completely necessary. ?

## 2021-07-09 ENCOUNTER — Ambulatory Visit (INDEPENDENT_AMBULATORY_CARE_PROVIDER_SITE_OTHER): Payer: Medicare PPO

## 2021-07-09 DIAGNOSIS — Z0000 Encounter for general adult medical examination without abnormal findings: Secondary | ICD-10-CM

## 2021-07-09 NOTE — Patient Instructions (Signed)

## 2021-07-09 NOTE — Progress Notes (Addendum)
Subjective:  I connected with  Brandy Orr on 07/09/21 by a audio enabled telemedicine application and verified that I am speaking with the correct person using two identifiers.  Patient Location: Home  Provider Location: Office/Clinic  I discussed the limitations of evaluation and management by telemedicine. The patient expressed understanding and agreed to proceed.      Brandy Orr is a 88 y.o. female who presents for Medicare Annual (Subsequent) preventive examination.  Review of Systems    Per HPI unless specifically indicated below        Objective:    There were no vitals filed for this visit. There is no height or weight on file to calculate BMI.     01/10/2017   10:06 AM 01/04/2017    8:10 AM 06/12/2016    7:37 PM 06/12/2016    1:02 PM  Advanced Directives  Does Patient Have a Medical Advance Directive? No No No Yes  Type of Advance Directive    [place] in Chart?    No - copy requested  Would patient like information on creating a medical advance directive?  No - Patient declined  No - Patient declined    Current Medications (verified) Outpatient Encounter Medications as of 07/09/2021  Medication Sig   acetaminophen (TYLENOL) 650 MG CR tablet Take 650-1,300 mg by mouth every 8 (eight) hours as needed for pain.    docusate sodium (COLACE) 100 MG capsule Take 1 capsule (100 mg total) by mouth 2 (two) times daily. To keep stools soft   Glucosamine-Chondroitin 500-400 MG CAPS Take by mouth.   traMADol-acetaminophen (ULTRACET) 37.5-325 MG tablet Take 1 tablet by mouth every 8 (eight) hours as needed for moderate pain.    pantoprazole (PROTONIX) 40 MG tablet Take 1 tablet (40 mg total) by mouth daily. (Patient not taking: Reported on 07/09/2021)   No facility-administered encounter medications on file as of 07/09/2021.    Allergies (verified) Oxycodone and Hydrocodone   History: Past Medical  History:  Diagnosis Date   Arthritis    Cancer ([place])    skin ca   Duodenal ulcer    Gastric ulcer    Past Surgical History:  Procedure Laterality Date   ABDOMINAL HYSTERECTOMY     CATARACT EXTRACTION, BILATERAL Bilateral 2012   ESOPHAGOGASTRODUODENOSCOPY (EGD) WITH PROPOFOL N/A 07/12/2014   Procedure: ESOPHAGOGASTRODUODENOSCOPY (EGD) WITH PROPOFOL;  Surgeon: Manya Silvas, MD;  Location: [facility] ENDOSCOPY;  Service: Endoscopy;  Laterality: N/A;   JOINT REPLACEMENT Right    THR   LAPAROSCOPIC BILATERAL SALPINGO OOPHERECTOMY Bilateral 01/10/2017   Procedure: LAPAROSCOPIC BILATERAL SALPINGO OOPHORECTOMY;  Surgeon: Benjaman Kindler, MD;  Location: [facility] ORS;  Service: Gynecology;  Laterality: Bilateral;   PERIPHERAL VASCULAR CATHETERIZATION N/A 07/24/2014   Procedure: IVC Filter Removal;  Surgeon: Katha Cabal, MD;  Location: [facility];  Service: Cardiovascular;  Laterality: N/A;   Family History  Problem Relation Age of Onset   Leukemia Mother    Diabetes Mother    Heart disease Father    Diabetes Father    Social History   Socioeconomic History   Marital status: Widowed    Spouse name: Not on file   Number of children: 1   Years of education: Not on file   Highest education level: Not on file  Occupational History   Occupation: retired  Tobacco Use   Smoking status: Never   Smokeless tobacco: Never  Vaping Use  Vaping Use: Never used  Substance and Sexual Activity   Alcohol use: No   Drug use: No   Sexual activity: Not on file  Other Topics Concern   Not on file  Social History Narrative   Not on file   Social Determinants of Health   Financial Resource Strain: Low Risk    Difficulty of Paying Living Expenses: Not hard at all  Food Insecurity: No Food Insecurity   Worried About Charity fundraiser in the Last Year: Never true   [place] in the Last Year: Never true  Transportation Needs: No Transportation Needs   Lack of Transportation  (Medical): No   Lack of Transportation (Non-Medical): No  Physical Activity: Inactive   Days of Exercise per Week: 0 days   Minutes of Exercise per Session: 0 min  Stress: Stress Concern Present   Feeling of Stress : Rather much  Social Connections: Socially Isolated   Frequency of Communication with Friends and Family: Once a week   Frequency of Social Gatherings with Friends and Family: Never   Attends Religious Services: Never   Marine scientist or Organizations: No   Attends Archivist Meetings: Never   Marital Status: Widowed    Tobacco Counseling Counseling given: Not Answered   Clinical Intake:     Pain : 0-10 Pain Type: Chronic pain Pain Location: Hip Pain Orientation: Right Pain Descriptors / Indicators: Aching Pain Onset: More than a month ago Pain Frequency: Intermittent     Nutritional Status: BMI of 19-24  Normal Nutritional Risks: None Diabetes: No  How often do you need to have someone help you when you read instructions, pamphlets, or other written materials from your doctor or pharmacy?: 1 - Never  Diabetic?No  Interpreter Needed?: No  Information entered by :: Brandy Orr, CMA   Activities of Daily Living    03/24/2021    2:42 PM  In your present state of health, do you have any difficulty performing the following activities:  Hearing? 0  Vision? 0  Difficulty concentrating or making decisions? 0  Walking or climbing stairs? 0  Dressing or bathing? 0  Doing errands, shopping? 0    Patient Care Team: Jearld Fenton, NP as PCP - General (Internal Medicine)  Indicate any recent Medical Services you may have received from other than [facility] providers in the past year (date may be approximate).    No hospitalization in past 12 months. Assessment:   This is a routine wellness examination for Brandy Orr.  Hearing/Vision screen No results found.  Dietary issues and exercise activities discussed: Current Exercise Habits:  The patient does not participate in regular exercise at present, Exercise limited by: orthopedic condition(s)   Goals Addressed   None    Depression Screen    07/09/2021    2:38 PM 03/24/2021    2:41 PM  PHQ 2/9 Scores  PHQ - 2 Score 0 2  PHQ- 9 Score  4    Fall Risk    07/09/2021    2:39 PM 07/09/2021    2:38 PM  Fall Risk   Falls in the past year? 0 0  Number falls in past yr: 0 0  Injury with Fall? 0 0  Risk for fall due to : No Fall Risks No Fall Risks  Follow up Falls evaluation completed Falls evaluation completed    [place]:  Any stairs in or around the home? Yes  If  so, are there any without handrails? Yes  Home free of loose throw rugs in walkways, pet beds, electrical cords, etc? Yes  Adequate lighting in your home to reduce risk of falls? Yes   ASSISTIVE DEVICES UTILIZED TO PREVENT FALLS:  Life alert? Yes  Use of a cane, walker or w/c? Yes  Grab bars in the bathroom? Yes  Shower chair or bench in shower? Yes  Elevated toilet seat or a handicapped toilet? Yes     Cognitive Function:        07/09/2021    2:22 PM  6CIT Screen  What Year? 0 points  What month? 0 points  What time? 0 points  Count back from 20 0 points  Months in reverse 0 points  Repeat phrase 0 points  Total Score 0 points    Immunizations Immunization History  Administered Date(s) Administered   Fluad Quad(high Dose 65+) 10/19/2018   H1N1 10/23/2014   Influenza, High Dose Seasonal PF 10/14/2015, 10/28/2016, 11/01/2017   Influenza-Unspecified 11/06/2011, 11/15/2013, 10/23/2014, 10/14/2015, 10/17/2015   PFIZER Comirnaty(Gray Top)Covid-19 Tri-Sucrose Vaccine 05/26/2019, 06/16/2019   Pneumococcal Conjugate-13 10/17/2015   Pneumococcal Polysaccharide-23 11/29/2018   Tdap 08/13/2010   Zoster, Live 10/08/2008    TDAP status: Due, Education has been provided regarding the importance of this vaccine. Advised may receive this vaccine at local  pharmacy or Health Dept. Aware to provide a copy of the vaccination record if obtained from local pharmacy or Health Dept. Verbalized acceptance and understanding.  Flu Vaccine status: Up to date  Pneumonia Vaccines: Completed   Covid-19 vaccine status: Completed vaccines  Qualifies for Shingles Vaccine? Yes   Zostavax completed Yes   Shingrix Completed?: No.    Education has been provided regarding the importance of this vaccine. Patient has been advised to call insurance company to determine out of pocket expense if they have not yet received this vaccine. Advised may also receive vaccine at local pharmacy or Health Dept. Verbalized acceptance and understanding.  Screening Tests Health Maintenance  Topic Date Due   Zoster Vaccines- Shingrix (1 of 2) Never done   DEXA SCAN  Never done   COVID-19 Vaccine (3 - Pfizer risk series) 07/14/2019   TETANUS/TDAP  08/12/2020   INFLUENZA VACCINE  09/08/2021   Pneumonia Vaccine 60+ Years old  Completed   HPV VACCINES  Aged Out    Health Maintenance  Health Maintenance Due  Topic Date Due   Zoster Vaccines- Shingrix (1 of 2) Never done   DEXA SCAN  Never done   COVID-19 Vaccine (3 - Pfizer risk series) 07/14/2019   TETANUS/TDAP  08/12/2020    Colon Rectal Cancer Screening: No longer required, discontinued for advance age   Mammogram: No longer required, discontinued for advance age    DEXA Scan: No longer required, discontinued for advance age    Lung Cancer Screening: (Low Dose CT Chest recommended if Age 59-80 years, 30 pack-year currently smoking OR have quit w/in 15years.) does not qualify.   Lung Cancer Screening Referral: does not qualify   Additional Screening:  Hepatitis C Screening: does qualify  Vision Screening: Recommended annual ophthalmology exams for early detection of glaucoma and other disorders of the eye. Is the patient up to date with their annual eye exam?  Yes Who is the provider or what is the name of  the office in which the patient attends annual eye exams? Dr. Ellin Mayhew  If pt is not established with a provider, would they like to be referred to a  provider to establish care? No .   Dental Screening: Recommended annual dental exams for proper oral hygiene  Community Resource Referral / Chronic Care Management: CRR required this visit?  No   CCM required this visit?  No      Plan:     I have personally reviewed and noted the following in the patient's chart:   Medical and social history Use of alcohol, tobacco or illicit drugs  Current medications and supplements including opioid prescriptions.  Functional ability and status Nutritional status Physical activity Advanced directives List of other physicians Hospitalizations, surgeries, and ER visits in previous 12 months Vitals Screenings to include cognitive, depression, and falls Referrals and appointments  In addition, I have reviewed and discussed with patient certain preventive protocols, quality metrics, and best practice recommendations. A written personalized care plan for preventive services as well as general preventive health recommendations were provided to patient.     Wilson Singer, [place]   07/09/2021

## 2021-09-21 ENCOUNTER — Other Ambulatory Visit: Payer: Self-pay | Admitting: Internal Medicine

## 2021-09-22 ENCOUNTER — Encounter: Payer: Self-pay | Admitting: Internal Medicine

## 2021-09-22 NOTE — Telephone Encounter (Signed)
Requested Prescriptions  Pending Prescriptions Disp Refills  . pantoprazole (PROTONIX) 40 MG tablet [Pharmacy Med Name: PANTOPRAZOLE '40MG'$  TABLETS] 90 tablet 1    Sig: TAKE 1 TABLET(40 MG) BY MOUTH DAILY     Gastroenterology: Proton Pump Inhibitors Passed - 09/21/2021 10:10 AM      Passed - Valid encounter within last 12 months    Recent Outpatient Visits          6 months ago Aortic atherosclerosis [facility])   [facility], Coralie Keens, NP      Future Appointments            In 1 week Garnette Gunner, Coralie Keens, NP [facility], [facility]

## 2021-10-05 ENCOUNTER — Encounter: Payer: Self-pay | Admitting: Internal Medicine

## 2021-10-05 ENCOUNTER — Ambulatory Visit (INDEPENDENT_AMBULATORY_CARE_PROVIDER_SITE_OTHER): Payer: Medicare PPO | Admitting: Internal Medicine

## 2021-10-05 VITALS — BP 136/76 | HR 94 | Temp 97.3°F | Wt 156.0 lb

## 2021-10-05 DIAGNOSIS — Z6825 Body mass index (BMI) 25.0-25.9, adult: Secondary | ICD-10-CM | POA: Insufficient documentation

## 2021-10-05 DIAGNOSIS — E663 Overweight: Secondary | ICD-10-CM

## 2021-10-05 DIAGNOSIS — Z0001 Encounter for general adult medical examination with abnormal findings: Secondary | ICD-10-CM | POA: Diagnosis not present

## 2021-10-05 LAB — CBC
HCT: 37.3 % (ref 35.0–45.0)
Hemoglobin: 12.6 g/dL (ref 11.7–15.5)
MCV: 97.6 fL (ref 80.0–100.0)
MPV: 10.4 fL (ref 7.5–12.5)

## 2021-10-05 MED ORDER — PANTOPRAZOLE SODIUM 20 MG PO TBEC: 20.0000 mg | DELAYED_RELEASE_TABLET | Freq: Every day | ORAL | 1 refills | Status: DC

## 2021-10-05 NOTE — Assessment & Plan Note (Signed)
Encourage diet and exercise for weight loss 

## 2021-10-05 NOTE — Progress Notes (Signed)
Subjective:    Patient ID: Brandy Orr, female    DOB: Jun 28, 1932, 88 y.o.   MRN: [id number]  HPI  Patient presents to clinic today for her annual exam.  Flu: 10/2018 Tetanus: 08/2010 COVID: Pfizer x2 Pneumovax: 11/2018 Prevnar: 10/2015 Zostavax: 09/2008 Shingrix: Never Pap smear: Hysterectomy Mammogram: No longer screening Bone density:  12/2019 Colon screening: No longer screening Vision screening: annually Dentist: dentures  Diet: She rarely eats meats. She consumes fruits and veggies. She tries to avoid fried foods. She drinks mostly water, [employer] Exercise: None   Review of Systems  Past Medical History:  Diagnosis Date   Arthritis    Cancer ([facility])    skin ca   Duodenal ulcer    Gastric ulcer     Current Outpatient Medications  Medication Sig Dispense Refill   acetaminophen (TYLENOL) 650 MG CR tablet Take 650-1,300 mg by mouth every 8 (eight) hours as needed for pain.      docusate sodium (COLACE) 100 MG capsule Take 1 capsule (100 mg total) by mouth 2 (two) times daily. To keep stools soft 30 capsule 0   Glucosamine-Chondroitin 500-400 MG CAPS Take by mouth.     pantoprazole (PROTONIX) 40 MG tablet TAKE 1 TABLET(40 MG) BY MOUTH DAILY 90 tablet 1   traMADol-acetaminophen (ULTRACET) 37.5-325 MG tablet Take 1 tablet by mouth every 8 (eight) hours as needed for moderate pain.   3   No current facility-administered medications for this visit.    Allergies  Allergen Reactions   Oxycodone Nausea And Vomiting   Hydrocodone Nausea And Vomiting    Family History  Problem Relation Age of Onset   Leukemia Mother    Diabetes Mother    Heart disease Father    Diabetes Father     Social History   Socioeconomic History   Marital status: Widowed    Spouse name: Not on file   Number of children: 1   Years of education: Not on file   Highest education level: Not on file  Occupational History   Occupation: retired  Tobacco Use   Smoking status: Never    Smokeless tobacco: Never  Vaping Use   Vaping Use: Never used  Substance and Sexual Activity   Alcohol use: No   Drug use: No   Sexual activity: Not on file  Other Topics Concern   Not on file  Social History Narrative   Not on file   Social Determinants of Health   Financial Resource Strain: Low Risk  (07/09/2021)   Overall Financial Resource Strain (CARDIA)    Difficulty of Paying Living Expenses: Not hard at all  Food Insecurity: No Food Insecurity (07/09/2021)   Hunger Vital Sign    Worried About Running Out of Food in the Last Year: Never true    Ran Out of Food in the Last Year: Never true  Transportation Needs: No Transportation Needs (07/09/2021)   PRAPARE - Administrator, Civil Service (Medical): No    Lack of Transportation (Non-Medical): No  Physical Activity: Inactive (07/09/2021)   Exercise Vital Sign    Days of Exercise per Week: 0 days    Minutes of Exercise per Session: 0 min  Stress: Stress Concern Present (07/09/2021)   [employer] of Occupational Health - Occupational Stress Questionnaire    Feeling of Stress : Rather much  Social Connections: Socially Isolated (07/09/2021)   Social Connection and Isolation Panel [NHANES]    Frequency of Communication with Friends and Family:  Once a week    Frequency of Social Gatherings with Friends and Family: Never    Attends Religious Services: Never    Marine scientist or Organizations: No    Attends Archivist Meetings: Never    Marital Status: Widowed  Intimate Partner Violence: Not At Risk (07/09/2021)   Humiliation, Afraid, Rape, and Kick questionnaire    Fear of Current or Ex-Partner: No    Emotionally Abused: No    Physically Abused: No    Sexually Abused: No     Constitutional: Denies fever, malaise, fatigue, headache or abrupt weight changes.  HEENT: Pt reports right eye watering. Denies eye pain, eye redness, ear pain, ringing in the ears, wax buildup, runny nose, nasal congestion,  bloody nose, or sore throat. Respiratory: Denies difficulty breathing, shortness of breath, cough or sputum production.   Cardiovascular: Denies chest pain, chest tightness, palpitations or swelling in the hands or feet.  Gastrointestinal: Pt reports intermittent constipation. Denies abdominal pain, bloating, diarrhea or blood in the stool.  GU: Pt reports urge incontinence. Denies frequency, pain with urination, burning sensation, blood in urine, odor or discharge. Musculoskeletal: Patient reports joint pain.  Denies decrease in range of motion, difficulty with gait, muscle pain or joint swelling.  Skin: Denies redness, rashes, lesions or ulcercations.  Neurological: Denies dizziness, difficulty with memory, difficulty with speech or problems with balance and coordination.  Psych: Denies anxiety, depression, SI/HI.  No other specific complaints in a complete review of systems (except as listed in HPI above).     Objective:   Physical Exam BP 136/76 (BP Location: Left Arm, Patient Position: Sitting, Cuff Size: Normal)   Pulse 94   Temp (!) 97.3 F (36.3 C) (Temporal)   Wt 156 lb (70.8 kg)   SpO2 97%   BMI 25.18 kg/m   Wt Readings from Last 3 Encounters:  03/24/21 153 lb 12.8 oz (69.8 kg)  01/04/17 164 lb 9.6 oz (74.7 kg)  06/12/16 158 lb 9.6 oz (71.9 kg)    General: Appears her stated age, well developed, well nourished in NAD. Skin: Warm, dry and intact.  HEENT: Head: normal shape and size; Eyes: sclera white, no icterus, conjunctiva pink, PERRLA and EOMs intact; Neck:  Neck supple, trachea midline. No masses, lumps or thyromegaly present.  Cardiovascular: Normal rate and rhythm. S1,S2 noted.  No murmur, rubs or gallops noted. No JVD or BLE edema. No carotid bruits noted. Pulmonary/Chest: Normal effort and positive vesicular breath sounds. No respiratory distress. No wheezes, rales or ronchi noted.  Abdomen: Soft and nontender. Normal bowel sounds.  Musculoskeletal: Strength 5/5  BUE/BLE. Gait slow and steady with use of cane. Neurological: Alert and oriented. Cranial nerves II-XII grossly intact. Coordination normal.  Psychiatric: Mood and affect normal. Behavior is normal. Judgment and thought content normal.   BMET    Component Value Date/Time   NA 140 01/04/2017 0839   NA 139 03/11/2014 1411   K 3.7 01/04/2017 0839   K 3.6 03/11/2014 1411   CL 107 01/04/2017 0839   CL 103 03/11/2014 1411   CO2 25 01/04/2017 0839   CO2 29 03/11/2014 1411   GLUCOSE 97 01/04/2017 0839   GLUCOSE 79 03/11/2014 1411   BUN 16 01/04/2017 0839   BUN 15 03/11/2014 1411   CREATININE 0.90 10/03/2017 1101   CREATININE 0.85 03/11/2014 1411   CALCIUM 9.2 01/04/2017 0839   CALCIUM 9.0 03/11/2014 1411   GFRNONAA >60 01/04/2017 0839   GFRNONAA >60 03/11/2014 1411  GFRAA >60 01/04/2017 0839   GFRAA >60 03/11/2014 1411    Lipid Panel  No results found for: "CHOL", "TRIG", "HDL", "CHOLHDL", "VLDL", "LDLCALC"  CBC    Component Value Date/Time   WBC 5.3 01/04/2017 0839   RBC 3.86 01/04/2017 0839   HGB 12.8 01/04/2017 0839   HGB 12.7 03/11/2014 1411   HCT 37.6 01/04/2017 0839   HCT 37.3 03/11/2014 1411   PLT 282 01/04/2017 0839   PLT 341 03/11/2014 1411   MCV 97.3 01/04/2017 0839   MCV 97 03/11/2014 1411   MCH 33.1 01/04/2017 0839   MCHC 34.0 01/04/2017 0839   RDW 13.3 01/04/2017 0839   RDW 13.4 03/11/2014 1411    Hgb A1C No results found for: "HGBA1C"          Assessment & Plan:   Preventive Health Maintenance:  Encouraged her to get a flu shot in the fall She declines tetanus for financial reasons, advised if she gets bit or cut to get this done Encouraged her to get a COVID booster Pneumovax and Prevnar UTD Zostavax UTD Discussed Shingrix vaccine, she will check coverage with her insurance company and get this done at the pharmacy if she would like She no longer wants to screen for cervical, breast or colon cancer Bone density UTD Encouraged her to  consume a balanced diet and exercise regimen Advised her to see an eye doctor and dentist annually We will check CBC, c-Met and lipid profile today  RTC in 6 months, follow-up chronic conditions Webb Silversmith, NP

## 2021-10-05 NOTE — Patient Instructions (Signed)
Health Maintenance for Postmenopausal Women Menopause is a normal process in which your ability to get pregnant comes to an end. This process happens slowly over many months or years, usually between the ages of 48 and 55. Menopause is complete when you have missed your menstrual period for 12 months. It is important to talk with your health care provider about some of the most common conditions that affect women after menopause (postmenopausal women). These include heart disease, cancer, and bone loss (osteoporosis). Adopting a healthy lifestyle and getting preventive care can help to promote your health and wellness. The actions you take can also lower your chances of developing some of these common conditions. What are the signs and symptoms of menopause? During menopause, you may have the following symptoms: Hot flashes. These can be moderate or severe. Night sweats. Decrease in sex drive. Mood swings. Headaches. Tiredness (fatigue). Irritability. Memory problems. Problems falling asleep or staying asleep. Talk with your health care provider about treatment options for your symptoms. Do I need hormone replacement therapy? Hormone replacement therapy is effective in treating symptoms that are caused by menopause, such as hot flashes and night sweats. Hormone replacement carries certain risks, especially as you become older. If you are thinking about using estrogen or estrogen with progestin, discuss the benefits and risks with your health care provider. How can I reduce my risk for heart disease and stroke? The risk of heart disease, heart attack, and stroke increases as you age. One of the causes may be a change in the body's hormones during menopause. This can affect how your body uses dietary fats, triglycerides, and cholesterol. Heart attack and stroke are medical emergencies. There are many things that you can do to help prevent heart disease and stroke. Watch your blood pressure High  blood pressure causes heart disease and increases the risk of stroke. This is more likely to develop in people who have high blood pressure readings or are overweight. Have your blood pressure checked: Every 3-5 years if you are 18-39 years of age. Every year if you are 40 years old or older. Eat a healthy diet  Eat a diet that includes plenty of vegetables, fruits, low-fat dairy products, and lean protein. Do not eat a lot of foods that are high in solid fats, added sugars, or sodium. Get regular exercise Get regular exercise. This is one of the most important things you can do for your health. Most adults should: Try to exercise for at least 150 minutes each week. The exercise should increase your heart rate and make you sweat (moderate-intensity exercise). Try to do strengthening exercises at least twice each week. Do these in addition to the moderate-intensity exercise. Spend less time sitting. Even light physical activity can be beneficial. Other tips Work with your health care provider to achieve or maintain a healthy weight. Do not use any products that contain nicotine or tobacco. These products include cigarettes, chewing tobacco, and vaping devices, such as e-cigarettes. If you need help quitting, ask your health care provider. Know your numbers. Ask your health care provider to check your cholesterol and your blood sugar (glucose). Continue to have your blood tested as directed by your health care provider. Do I need screening for cancer? Depending on your health history and family history, you may need to have cancer screenings at different stages of your life. This may include screening for: Breast cancer. Cervical cancer. Lung cancer. Colorectal cancer. What is my risk for osteoporosis? After menopause, you may be   at increased risk for osteoporosis. Osteoporosis is a condition in which bone destruction happens more quickly than new bone creation. To help prevent osteoporosis or  the bone fractures that can happen because of osteoporosis, you may take the following actions: If you are 19-50 years old, get at least 1,000 mg of calcium and at least 600 international units (IU) of vitamin D per day. If you are older than age 50 but younger than age 70, get at least 1,200 mg of calcium and at least 600 international units (IU) of vitamin D per day. If you are older than age 70, get at least 1,200 mg of calcium and at least 800 international units (IU) of vitamin D per day. Smoking and drinking excessive alcohol increase the risk of osteoporosis. Eat foods that are rich in calcium and vitamin D, and do weight-bearing exercises several times each week as directed by your health care provider. How does menopause affect my mental health? Depression may occur at any age, but it is more common as you become older. Common symptoms of depression include: Feeling depressed. Changes in sleep patterns. Changes in appetite or eating patterns. Feeling an overall lack of motivation or enjoyment of activities that you previously enjoyed. Frequent crying spells. Talk with your health care provider if you think that you are experiencing any of these symptoms. General instructions See your health care provider for regular wellness exams and vaccines. This may include: Scheduling regular health, dental, and eye exams. Getting and maintaining your vaccines. These include: Influenza vaccine. Get this vaccine each year before the flu season begins. Pneumonia vaccine. Shingles vaccine. Tetanus, diphtheria, and pertussis (Tdap) booster vaccine. Your health care provider may also recommend other immunizations. Tell your health care provider if you have ever been abused or do not feel safe at home. Summary Menopause is a normal process in which your ability to get pregnant comes to an end. This condition causes hot flashes, night sweats, decreased interest in sex, mood swings, headaches, or lack  of sleep. Treatment for this condition may include hormone replacement therapy. Take actions to keep yourself healthy, including exercising regularly, eating a healthy diet, watching your weight, and checking your blood pressure and blood sugar levels. Get screened for cancer and depression. Make sure that you are up to date with all your vaccines. This information is not intended to replace advice given to you by your health care provider. Make sure you discuss any questions you have with your health care provider. Document Revised: 06/16/2020 Document Reviewed: 06/16/2020 Elsevier Patient Education  2023 Elsevier Inc.  

## 2021-10-06 ENCOUNTER — Telehealth: Payer: Self-pay

## 2021-10-06 LAB — COMPLETE METABOLIC PANEL WITH GFR
AG Ratio: 1.8 (calc) (ref 1.0–2.5)
ALT: 9 U/L (ref 6–29)
AST: 14 U/L (ref 10–35)
Albumin: 4.1 g/dL (ref 3.6–5.1)
Alkaline phosphatase (APISO): 50 U/L (ref 37–153)
BUN/Creatinine Ratio: 18 (calc) (ref 6–22)
BUN: 18 mg/dL (ref 7–25)
CO2: 22 mmol/L (ref 20–32)
Calcium: 9.5 mg/dL (ref 8.6–10.4)
Chloride: 108 mmol/L (ref 98–110)
Creat: 0.98 mg/dL — ABNORMAL HIGH (ref 0.60–0.95)
Globulin: 2.3 g/dL (calc) (ref 1.9–3.7)
Glucose, Bld: 102 mg/dL — ABNORMAL HIGH (ref 65–99)
Potassium: 3.9 mmol/L (ref 3.5–5.3)
Sodium: 141 mmol/L (ref 135–146)
Total Bilirubin: 0.5 mg/dL (ref 0.2–1.2)
Total Protein: 6.4 g/dL (ref 6.1–8.1)
eGFR: 56 mL/min/{1.73_m2} — ABNORMAL LOW (ref >=60)

## 2021-10-06 LAB — LIPID PANEL
Cholesterol: 236 mg/dL — ABNORMAL HIGH (ref <200)
HDL: 76 mg/dL (ref >=50)
LDL Cholesterol (Calc): 137 mg/dL (calc) — ABNORMAL HIGH
Non-HDL Cholesterol (Calc): 160 mg/dL (calc) — ABNORMAL HIGH (ref <130)
Total CHOL/HDL Ratio: 3.1 (calc) (ref <5.0)
Triglycerides: 109 mg/dL (ref <150)

## 2021-10-06 LAB — CBC
MCH: 33 pg (ref 27.0–33.0)
MCHC: 33.8 g/dL (ref 32.0–36.0)
Platelets: 289 10*3/uL (ref 140–400)
RBC: 3.82 10*6/uL (ref 3.80–5.10)
RDW: 12.5 % (ref 11.0–15.0)
WBC: 5.2 10*3/uL (ref 3.8–10.8)

## 2021-10-06 NOTE — Telephone Encounter (Signed)
Noted  

## 2021-10-06 NOTE — Telephone Encounter (Signed)
Copied from [place] [id number]. Topic: General - Inquiry >> Oct 05, 2021  3:59 PM Rosanne Ashing P wrote: Reason for CRM: Pt called saying she took a tramadol before her labs were done this morning.  She wanted the doctor to know in case something showed up.

## 2021-10-06 NOTE — Telephone Encounter (Signed)
FYI

## 2021-10-14 ENCOUNTER — Ambulatory Visit: Payer: Self-pay

## 2021-10-14 NOTE — Telephone Encounter (Signed)
Pt states she had extreme diarrhea and chest pain x2d   Pt states provider decreased her  pantoprazole (PROTONIX) to 20 MG tablet and she was having extreme diarrhea and chest pain x2d   Pt stated the decrease is was caused the symptoms and after taking her normal 40 mg the symptoms has stopped   Pt states last symptom was this morning   Pt inquiring if she should continue taking the 40 mg   Please fu w/ pt     Chief Complaint: Had decreased Protonix as ordered. Yesterday had diarrhea, had chest pressure today. Took a 20 mg Protonix (extra pill) and symptoms went away.Should she go back on 40 mg daily. Symptoms: Has some bloating on right side of abdomen. Frequency: Yesterday Pertinent Negatives: Patient denies  Disposition: '[]'$ ED /'[]'$ Urgent Care (no appt availability in office) / '[]'$ Appointment(In office/virtual)/ '[]'$  [place] [facility]/ '[]'$ Home Care/ '[]'$ Refused Recommended Disposition /'[]'$ [facility] [facility]/ '[x]'$  Follow-up with PCP Additional Notes: Please advise pt.  Answer Assessment - Initial Assessment Questions 1. NAME of MEDICINE: "What medicine(s) are you calling about?"     Protonix 40 mg 2. QUESTION: "What is your question?" (e.g., double dose of medicine, side effect)     Yesterday had diarrhea and had chest pain this morning. Took the additional 20 mg and pain went away. 3. PRESCRIBER: "Who prescribed the medicine?" Reason: if prescribed by specialist, call should be referred to that group.     Baity 4. SYMPTOMS: "Do you have any symptoms?" If Yes, ask: "What symptoms are you having?"  "How bad are the symptoms (e.g., mild, moderate, severe)     Diarrhea and chest pressure. The pressure lasted a few minutes 5. PREGNANCY:  "Is there any chance that you are pregnant?" "When was your last menstrual period?"     No  Protocols used: Medication Question Call-A-AH

## 2021-10-15 MED ORDER — PANTOPRAZOLE SODIUM 40 MG PO TBEC: 40.0000 mg | DELAYED_RELEASE_TABLET | Freq: Every day | ORAL | 0 refills | Status: DC

## 2021-10-15 NOTE — Addendum Note (Signed)
Addended by: Jearld Fenton on: 10/15/2021 11:37 AM   Modules accepted: Orders

## 2021-10-15 NOTE — Telephone Encounter (Signed)
We will increase back to 40 mg daily and send Rx to pharmacy.  She can take 2 20 mg capsules at a time until she runs out.  If she has recurrent chest pain, would recommend she get evaluated.

## 2021-10-15 NOTE — Telephone Encounter (Signed)
Pt advised.   Thanks,   -Akiyah Eppolito  

## 2021-10-26 ENCOUNTER — Ambulatory Visit (INDEPENDENT_AMBULATORY_CARE_PROVIDER_SITE_OTHER): Payer: Medicare PPO

## 2021-10-26 DIAGNOSIS — Z23 Encounter for immunization: Secondary | ICD-10-CM

## 2022-03-03 DIAGNOSIS — L814 Other melanin hyperpigmentation: Secondary | ICD-10-CM | POA: Diagnosis not present

## 2022-03-03 DIAGNOSIS — L821 Other seborrheic keratosis: Secondary | ICD-10-CM | POA: Diagnosis not present

## 2022-03-03 DIAGNOSIS — D2271 Melanocytic nevi of right lower limb, including hip: Secondary | ICD-10-CM | POA: Diagnosis not present

## 2022-03-03 DIAGNOSIS — X32XXXA Exposure to sunlight, initial encounter: Secondary | ICD-10-CM | POA: Diagnosis not present

## 2022-03-03 DIAGNOSIS — D2262 Melanocytic nevi of left upper limb, including shoulder: Secondary | ICD-10-CM | POA: Diagnosis not present

## 2022-03-03 DIAGNOSIS — D2272 Melanocytic nevi of left lower limb, including hip: Secondary | ICD-10-CM | POA: Diagnosis not present

## 2022-03-03 DIAGNOSIS — D225 Melanocytic nevi of trunk: Secondary | ICD-10-CM | POA: Diagnosis not present

## 2022-03-03 DIAGNOSIS — D2261 Melanocytic nevi of right upper limb, including shoulder: Secondary | ICD-10-CM | POA: Diagnosis not present

## 2022-03-25 ENCOUNTER — Other Ambulatory Visit: Payer: Self-pay | Admitting: Internal Medicine

## 2022-03-25 NOTE — Telephone Encounter (Signed)
Requested medication (s) are due for refill today: yes  Requested medication (s) are on the active medication list: yes  Last refill:  10/15/21 #90 0 refills  Future visit scheduled: yes in 2 weeks  Notes to clinic:  no refills remain. Do you want to continue refills?     Requested Prescriptions  Pending Prescriptions Disp Refills   pantoprazole (PROTONIX) 20 MG tablet [Pharmacy Med Name: PANTOPRAZOLE 20MG TABLETS] 90 tablet 1    Sig: TAKE 1 TABLET(20 MG) BY MOUTH DAILY     Gastroenterology: Proton Pump Inhibitors Passed - 03/25/2022  5:12 PM      Passed - Valid encounter within last 12 months    Recent Outpatient Visits           5 months ago Encounter for general adult medical examination with abnormal findings   [facility], Coralie Keens, NP   1 year ago Aortic atherosclerosis [facility])   [facility] [place], Coralie Keens, NP       Future Appointments             In 2 weeks Garnette Gunner, Coralie Keens, NP [facility], [facility]

## 2022-04-08 ENCOUNTER — Encounter: Payer: Self-pay | Admitting: Internal Medicine

## 2022-04-08 ENCOUNTER — Ambulatory Visit: Payer: Medicare PPO | Admitting: Internal Medicine

## 2022-04-08 VITALS — BP 126/76 | HR 87 | Temp 96.8°F | Wt 153.0 lb

## 2022-04-08 DIAGNOSIS — I700 Atherosclerosis of aorta: Secondary | ICD-10-CM

## 2022-04-08 DIAGNOSIS — R7309 Other abnormal glucose: Secondary | ICD-10-CM

## 2022-04-08 DIAGNOSIS — M159 Polyosteoarthritis, unspecified: Secondary | ICD-10-CM | POA: Diagnosis not present

## 2022-04-08 DIAGNOSIS — K219 Gastro-esophageal reflux disease without esophagitis: Secondary | ICD-10-CM | POA: Diagnosis not present

## 2022-04-08 DIAGNOSIS — E7800 Pure hypercholesterolemia, unspecified: Secondary | ICD-10-CM | POA: Insufficient documentation

## 2022-04-08 DIAGNOSIS — N1831 Chronic kidney disease, stage 3a: Secondary | ICD-10-CM

## 2022-04-08 DIAGNOSIS — N183 Chronic kidney disease, stage 3 unspecified: Secondary | ICD-10-CM | POA: Insufficient documentation

## 2022-04-08 MED ORDER — ASPIRIN 81 MG PO TBEC: 81.0000 mg | DELAYED_RELEASE_TABLET | Freq: Every day | ORAL | 12 refills | Status: DC

## 2022-04-08 NOTE — Assessment & Plan Note (Signed)
Continue Ultracet as needed

## 2022-04-08 NOTE — Assessment & Plan Note (Signed)
Given her difficulty swallowing, will continue pantoprazole 20 mg daily

## 2022-04-08 NOTE — Assessment & Plan Note (Signed)
C-Met and lipid profile today Encouraged her to some low-fat diet Advised her to start enteric-coated aspirin 81 mg daily

## 2022-04-08 NOTE — Progress Notes (Signed)
Subjective:    Patient ID: Brandy Orr, female    DOB: [birth year], 89 y.o.   MRN: [id number]  HPI  Patient presents to clinic today for follow-up of chronic conditions.  OA: Generalized but worse in her neck and back.  She takes Ultracet as prescribed with good relief of symptoms.  She does not follow with orthopedics.  GERD: She is not sure what triggers this. She has been having some difficulty swallowing. She takes Pantoprazole as prescribed.  Upper GI from 07/2014 reviewed.  CKD 3: Her last creatinine was 1.98, GFR 56, 09/2021.  She is not currently on an ACEI/ARB.  She does not follow with nephrology.  HLD with Aortic Atherosclerosis: Her last LDL was 137, triglycerides 109, 09/2021.  She is not taking any cholesterol-lowering medication at this time.  She is not taking Aspirin daily.  She tries to consume a low-fat diet.  Review of Systems     Past Medical History:  Diagnosis Date   Arthritis    Cancer ([place])    skin ca   Duodenal ulcer    Gastric ulcer     Current Outpatient Medications  Medication Sig Dispense Refill   acetaminophen (TYLENOL) 650 MG CR tablet Take 650-1,300 mg by mouth every 8 (eight) hours as needed for pain.      Glucosamine-Chondroitin 500-400 MG CAPS Take by mouth.     pantoprazole (PROTONIX) 40 MG tablet Take 1 tablet (40 mg total) by mouth daily. 90 tablet 0   polyethylene glycol (MIRALAX / GLYCOLAX) 17 g packet Take 17 g by mouth daily.     traMADol-acetaminophen (ULTRACET) 37.5-325 MG tablet Take 1 tablet by mouth every 8 (eight) hours as needed for moderate pain.   3   No current facility-administered medications for this visit.    Allergies  Allergen Reactions   Oxycodone Nausea And Vomiting   Hydrocodone Nausea And Vomiting    Family History  Problem Relation Age of Onset   Leukemia Mother    Diabetes Mother    Heart disease Father    Diabetes Father     Social History   Socioeconomic History   Marital status: Widowed     Spouse name: Not on file   Number of children: 1   Years of education: Not on file   Highest education level: Not on file  Occupational History   Occupation: retired  Tobacco Use   Smoking status: Never   Smokeless tobacco: Never  Vaping Use   Vaping Use: Never used  Substance and Sexual Activity   Alcohol use: No   Drug use: No   Sexual activity: Not on file  Other Topics Concern   Not on file  Social History Narrative   Not on file   Social Determinants of Health   Financial Resource Strain: Low Risk  (07/09/2021)   Overall Financial Resource Strain (CARDIA)    Difficulty of Paying Living Expenses: Not hard at all  Food Insecurity: No Food Insecurity (07/09/2021)   Hunger Vital Sign    Worried About Running Out of Food in the Last Year: Never true    [place] in the Last Year: Never true  Transportation Needs: No Transportation Needs (07/09/2021)   PRAPARE - Hydrologist (Medical): No    Lack of Transportation (Non-Medical): No  Physical Activity: Inactive (07/09/2021)   Exercise Vital Sign    Days of Exercise per Week: 0 days    Minutes of  Exercise per Session: 0 min  Stress: Stress Concern Present (07/09/2021)   [place]    Feeling of Stress : Rather much  Social Connections: Socially Isolated (07/09/2021)   Social Connection and Isolation Panel [NHANES]    Frequency of Communication with Friends and Family: Once a week    Frequency of Social Gatherings with Friends and Family: Never    Attends Religious Services: Never    Marine scientist or Organizations: No    Attends Archivist Meetings: Never    Marital Status: Widowed  Intimate Partner Violence: Not At Risk (07/09/2021)   Humiliation, Afraid, Rape, and Kick questionnaire    Fear of Current or Ex-Partner: No    Emotionally Abused: No    Physically Abused: No    Sexually Abused: No      Constitutional: Denies fever, malaise, fatigue, headache or abrupt weight changes.  HEENT: Denies eye pain, eye redness, ear pain, ringing in the ears, wax buildup, runny nose, nasal congestion, bloody nose, or sore throat. Respiratory: Denies difficulty breathing, shortness of breath, cough or sputum production.   Cardiovascular: Denies chest pain, chest tightness, palpitations or swelling in the hands or feet.  Gastrointestinal: Pt reports difficulty swallowing, abdominal bloating, intermittent diarrhea. Denies abdominal pain, bloating, constipation, diarrhea or blood in the stool.  GU: Pt reports urge incontinence. Denies frequency, pain with urination, burning sensation, blood in urine, odor or discharge. Musculoskeletal: Patient reports joint pain.  Denies decrease in range of motion, difficulty with gait, muscle pain or joint swelling.  Skin: Denies redness, rashes, lesions or ulcercations.  Neurological: Denies dizziness, difficulty with memory, difficulty with speech or problems with balance and coordination.  Psych: Denies anxiety, depression, SI/HI.  No other specific complaints in a complete review of systems (except as listed in HPI above).  Objective:   Physical Exam  BP 126/76 (BP Location: Right Arm, Patient Position: Sitting, Cuff Size: Normal)   Pulse 87   Temp (!) 96.8 F (36 C) (Temporal)   Wt 153 lb (69.4 kg)   SpO2 98%   BMI 24.69 kg/m   Wt Readings from Last 3 Encounters:  10/05/21 156 lb (70.8 kg)  03/24/21 153 lb 12.8 oz (69.8 kg)  01/04/17 164 lb 9.6 oz (74.7 kg)    General: Appears her stated age, well developed, well nourished in NAD. Skin: Warm, dry and intact.  HEENT: Head: normal shape and size; Eyes: sclera white, no icterus, conjunctiva pink, PERRLA and EOMs intact;  Cardiovascular: Normal rate and rhythm. S1,S2 noted.  No murmur, rubs or gallops noted. No JVD or BLE edema. No carotid bruits noted. Pulmonary/Chest: Normal effort and positive  vesicular breath sounds. No respiratory distress. No wheezes, rales or ronchi noted.  Abdomen: Soft and nontender. Normal bowel sounds. No distention or masses noted. Liver, spleen and kidneys non palpable. Musculoskeletal: Kyphotic. Joint enlargement noted in hands. Gait slow and steady with use of cane. Neurological: Alert and oriented. Coordination normal.  Psychiatric: Mood and affect normal. Behavior is normal. Judgment and thought content normal.     BMET    Component Value Date/Time   NA 141 10/05/2021 1426   NA 139 03/11/2014 1411   K 3.9 10/05/2021 1426   K 3.6 03/11/2014 1411   CL 108 10/05/2021 1426   CL 103 03/11/2014 1411   CO2 22 10/05/2021 1426   CO2 29 03/11/2014 1411   GLUCOSE 102 (H) 10/05/2021 1426   GLUCOSE 79 03/11/2014  1411   BUN 18 10/05/2021 1426   BUN 15 03/11/2014 1411   CREATININE 0.98 (H) 10/05/2021 1426   CALCIUM 9.5 10/05/2021 1426   CALCIUM 9.0 03/11/2014 1411   GFRNONAA >60 01/04/2017 0839   GFRNONAA >60 03/11/2014 1411   GFRAA >60 01/04/2017 0839   GFRAA >60 03/11/2014 1411    Lipid Panel     Component Value Date/Time   CHOL 236 (H) 10/05/2021 1426   TRIG 109 10/05/2021 1426   HDL 76 10/05/2021 1426   CHOLHDL 3.1 10/05/2021 1426   LDLCALC 137 (H) 10/05/2021 1426    CBC    Component Value Date/Time   WBC 5.2 10/05/2021 1426   RBC 3.82 10/05/2021 1426   HGB 12.6 10/05/2021 1426   HGB 12.7 03/11/2014 1411   HCT 37.3 10/05/2021 1426   HCT 37.3 03/11/2014 1411   PLT 289 10/05/2021 1426   PLT 341 03/11/2014 1411   MCV 97.6 10/05/2021 1426   MCV 97 03/11/2014 1411   MCH 33.0 10/05/2021 1426   MCHC 33.8 10/05/2021 1426   RDW 12.5 10/05/2021 1426   RDW 13.4 03/11/2014 1411    Hgb A1C No results found for: "HGBA1C"          Assessment & Plan:     RTC in 6 months for annual exam Webb Silversmith, NP

## 2022-04-08 NOTE — Assessment & Plan Note (Signed)
C-Met and lipid profile today Encouraged her to consume low-fat diet 

## 2022-04-08 NOTE — Assessment & Plan Note (Signed)
C-Met today 

## 2022-04-08 NOTE — Patient Instructions (Signed)

## 2022-04-09 ENCOUNTER — Other Ambulatory Visit: Payer: Self-pay | Admitting: Internal Medicine

## 2022-04-09 NOTE — Telephone Encounter (Signed)
Pt called requesting for her traMADol-acetaminophen (ULTRACET) 37.5-325 MG tablet to get called in, she says she discussed this with her PCP during her recent visit.   [facility] [id number], [place] AT [facility] OF [address]  [place] [address]  Phone: [phone] Fax: [phone]    Pt is completely out of her current supply.

## 2022-04-12 ENCOUNTER — Other Ambulatory Visit: Payer: Self-pay

## 2022-04-12 DIAGNOSIS — E7800 Pure hypercholesterolemia, unspecified: Secondary | ICD-10-CM

## 2022-04-12 DIAGNOSIS — R7309 Other abnormal glucose: Secondary | ICD-10-CM

## 2022-04-12 DIAGNOSIS — N1831 Chronic kidney disease, stage 3a: Secondary | ICD-10-CM

## 2022-04-12 MED ORDER — TRAMADOL-ACETAMINOPHEN 37.5-325 MG PO TABS: 1.0000 | ORAL_TABLET | Freq: Three times a day (TID) | ORAL | 0 refills | Status: DC | PRN

## 2022-04-12 NOTE — Telephone Encounter (Signed)
Requested medications are due for refill today.  Unsure  Requested medications are on the active medications list.  yes  Last refill. 05/26/2016  Future visit scheduled.   yes  Notes to clinic.  Refill not delegated. Medication is historical.    Requested Prescriptions  Pending Prescriptions Disp Refills   traMADol-acetaminophen (ULTRACET) 37.5-325 MG tablet 30 tablet 3    Sig: Take 1 tablet by mouth every 8 (eight) hours as needed for moderate pain.     Not Delegated - Analgesics:  Opioid Agonist Combinations - acetaminophen / tramadol Failed - 04/09/2022  2:23 PM      Failed - This refill cannot be delegated      Failed - Cr in normal range and within 360 days    Creat  Date Value Ref Range Status  10/05/2021 0.98 (H) 0.60 - 0.95 mg/dL Final         Failed - Urine Drug Screen completed in last 360 days      Passed - AST in normal range and within 360 days    AST  Date Value Ref Range Status  10/05/2021 14 10 - 35 U/L Final         Passed - ALT in normal range and within 360 days    ALT  Date Value Ref Range Status  10/05/2021 9 6 - 29 U/L Final         Passed - Valid encounter within last 3 months    Recent Outpatient Visits           4 days ago Pure hypercholesterolemia   [place], Coralie Keens, NP   6 months ago Encounter for general adult medical examination with abnormal findings   [facility], Coralie Keens, NP   1 year ago Aortic atherosclerosis [facility])   [facility], Coralie Keens, NP       Future Appointments             In 6 months Baity, Coralie Keens, NP [facility], [facility]

## 2022-04-13 ENCOUNTER — Other Ambulatory Visit: Payer: Medicare PPO

## 2022-04-13 DIAGNOSIS — E7800 Pure hypercholesterolemia, unspecified: Secondary | ICD-10-CM | POA: Diagnosis not present

## 2022-04-13 DIAGNOSIS — R7309 Other abnormal glucose: Secondary | ICD-10-CM | POA: Diagnosis not present

## 2022-04-14 LAB — LIPID PANEL
Cholesterol: 228 mg/dL — ABNORMAL HIGH (ref <200)
HDL: 76 mg/dL (ref >=50)
LDL Cholesterol (Calc): 126 mg/dL (calc) — ABNORMAL HIGH
Non-HDL Cholesterol (Calc): 152 mg/dL (calc) — ABNORMAL HIGH (ref <130)
Total CHOL/HDL Ratio: 3 (calc) (ref <5.0)
Triglycerides: 147 mg/dL (ref <150)

## 2022-04-14 LAB — COMPLETE METABOLIC PANEL WITH GFR
AG Ratio: 1.6 (calc) (ref 1.0–2.5)
ALT: 11 U/L (ref 6–29)
AST: 16 U/L (ref 10–35)
Albumin: 4.1 g/dL (ref 3.6–5.1)
Alkaline phosphatase (APISO): 51 U/L (ref 37–153)
BUN/Creatinine Ratio: 17 (calc) (ref 6–22)
BUN: 20 mg/dL (ref 7–25)
CO2: 25 mmol/L (ref 20–32)
Calcium: 9.2 mg/dL (ref 8.6–10.4)
Chloride: 104 mmol/L (ref 98–110)
Creat: 1.17 mg/dL — ABNORMAL HIGH (ref 0.60–0.95)
Globulin: 2.6 g/dL (calc) (ref 1.9–3.7)
Glucose, Bld: 113 mg/dL — ABNORMAL HIGH (ref 65–99)
Potassium: 3.8 mmol/L (ref 3.5–5.3)
Sodium: 140 mmol/L (ref 135–146)
Total Bilirubin: 0.5 mg/dL (ref 0.2–1.2)
Total Protein: 6.7 g/dL (ref 6.1–8.1)
eGFR: 45 mL/min/{1.73_m2} — ABNORMAL LOW (ref >=60)

## 2022-04-14 LAB — CBC
HCT: 36.8 % (ref 35.0–45.0)
Hemoglobin: 12.7 g/dL (ref 11.7–15.5)
MCH: 33.9 pg — ABNORMAL HIGH (ref 27.0–33.0)
MCHC: 34.5 g/dL (ref 32.0–36.0)
MCV: 98.1 fL (ref 80.0–100.0)
MPV: 10.3 fL (ref 7.5–12.5)
Platelets: 318 10*3/uL (ref 140–400)
RBC: 3.75 10*6/uL — ABNORMAL LOW (ref 3.80–5.10)
RDW: 12.3 % (ref 11.0–15.0)
WBC: 6.6 10*3/uL (ref 3.8–10.8)

## 2022-04-14 LAB — HEMOGLOBIN A1C
Hgb A1c MFr Bld: 5.5 % of total Hgb (ref <5.7)
Mean Plasma Glucose: 111 mg/dL
eAG (mmol/L): 6.2 mmol/L

## 2022-05-10 ENCOUNTER — Telehealth: Payer: Self-pay | Admitting: Internal Medicine

## 2022-05-10 NOTE — Telephone Encounter (Unsigned)
Pt is calling discuss  pantoprazole (PROTONIX) 20 MG tablet [id number] as discussed 04/08/22 ov - discussed to stop the medication. Pt is concerned of the side effects of the medication including memory loss, or medication affecting her kidneys. Please advise CB- [phone]

## 2022-05-11 NOTE — Telephone Encounter (Signed)
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 The problem with stopping this is that the stricture may worsen and she may have to see GI to have her esophagus stretched. She will not notice a significant effect on her memory by stopping this medication and Pantoprazole does not affect the kidneys.

## 2022-05-11 NOTE — Telephone Encounter (Signed)
LMTCB 05/11/2022.   PEC please advise pt when she calls back.   Thanks,   -Mickel Baas

## 2022-05-11 NOTE — Telephone Encounter (Signed)
The side effect of memory loss is more than likely age related. There is no ct head or MRI brain to review. If she wants to stop the medication, I am fine with that. If her difficulty swallowing increases, she will need to restart or be referred to GI for an upper endoscopy.

## 2022-05-11 NOTE — Telephone Encounter (Signed)
Pt called back, advised of message from [place], [place]. Pt verbalized understanding and states she believes she has all of the SE from the medication. Pt would like for someone to tell her whether she needs to stop the medication vs stay on the medication. Pt has appt tomorrow with [facility] and will speak with them regarding this medication. Pt states that she gets the pamphlet from the pharmacy on the medication and she states it says it can affect the kidneys. Pt is going to talk with Neph[facility] tomorrow and will call back with update of what they say about medication.

## 2022-05-12 DIAGNOSIS — K219 Gastro-esophageal reflux disease without esophagitis: Secondary | ICD-10-CM | POA: Diagnosis not present

## 2022-05-12 DIAGNOSIS — E785 Hyperlipidemia, unspecified: Secondary | ICD-10-CM | POA: Diagnosis not present

## 2022-05-12 DIAGNOSIS — R8290 Unspecified abnormal findings in urine: Secondary | ICD-10-CM | POA: Diagnosis not present

## 2022-05-12 DIAGNOSIS — M150 Primary generalized (osteo)arthritis: Secondary | ICD-10-CM | POA: Diagnosis not present

## 2022-05-12 DIAGNOSIS — N1831 Chronic kidney disease, stage 3a: Secondary | ICD-10-CM | POA: Diagnosis not present

## 2022-05-13 NOTE — Telephone Encounter (Signed)
Pt advised.   Thanks,   -Anniya Whiters  

## 2022-05-14 ENCOUNTER — Other Ambulatory Visit: Payer: Self-pay | Admitting: Nephrology

## 2022-05-14 DIAGNOSIS — N1831 Chronic kidney disease, stage 3a: Secondary | ICD-10-CM

## 2022-05-14 DIAGNOSIS — R8290 Unspecified abnormal findings in urine: Secondary | ICD-10-CM

## 2022-05-25 ENCOUNTER — Ambulatory Visit
Admission: RE | Admit: 2022-05-25 | Discharge: 2022-05-25 | Disposition: A | Discharge status: Home / Self Care | Payer: Medicare PPO | Source: Ambulatory Visit | Attending: Nephrology | Admitting: Nephrology

## 2022-05-25 DIAGNOSIS — R8290 Unspecified abnormal findings in urine: Secondary | ICD-10-CM | POA: Diagnosis not present

## 2022-05-25 DIAGNOSIS — N1831 Chronic kidney disease, stage 3a: Secondary | ICD-10-CM | POA: Diagnosis not present

## 2022-07-16 ENCOUNTER — Ambulatory Visit (INDEPENDENT_AMBULATORY_CARE_PROVIDER_SITE_OTHER): Payer: Medicare PPO

## 2022-07-16 VITALS — Wt 153.0 lb

## 2022-07-16 DIAGNOSIS — Z0000 Encounter for general adult medical examination without abnormal findings: Secondary | ICD-10-CM | POA: Diagnosis not present

## 2022-07-16 NOTE — Progress Notes (Signed)
I connected with  Brandy Orr on 07/16/22 by a audio enabled telemedicine application and verified that I am speaking with the correct person using two identifiers.  Patient Location: Home  Provider Location: Office/Clinic  I discussed the limitations of evaluation and management by telemedicine. The patient expressed understanding and agreed to proceed.  Subjective:   Brandy Orr is a 89 y.o. female who presents for Medicare Annual (Subsequent) preventive examination.  Review of Systems     Cardiac Risk Factors include: advanced age (>69men, >37 women);sedentary lifestyle     Objective:    Today's Vitals   07/16/22 1059 07/16/22 1121  Weight:  153 lb (69.4 kg)  PainSc: 5     Body mass index is 24.69 kg/m.     07/16/2022   11:10 AM 01/10/2017   10:06 AM 01/04/2017    8:10 AM 06/12/2016    7:37 PM 06/12/2016    1:02 PM  Advanced Directives  Does Patient Have a Medical Advance Directive? No No No No Yes  Type of Research officer, trade union of Healthcare Power of Attorney in Chart?     No - copy requested  Would patient like information on creating a medical advance directive? No - Patient declined  No - Patient declined  No - Patient declined    Current Medications (verified) Outpatient Encounter Medications as of 07/16/2022  Medication Sig   acetaminophen (TYLENOL) 650 MG CR tablet Take 650-1,300 mg by mouth every 8 (eight) hours as needed for pain.    aspirin EC 81 MG tablet Take 1 tablet (81 mg total) by mouth daily. Swallow whole.   Glucosamine-Chondroitin 500-400 MG CAPS Take by mouth.   pantoprazole (PROTONIX) 20 MG tablet Take 20 mg by mouth daily.   polyethylene glycol (MIRALAX / GLYCOLAX) 17 g packet Take 17 g by mouth daily.   traMADol-acetaminophen (ULTRACET) 37.5-325 MG tablet Take 1 tablet by mouth every 8 (eight) hours as needed for moderate pain.   No facility-administered encounter medications on file as of 07/16/2022.     Allergies (verified) Oxycodone and Hydrocodone   History: Past Medical History:  Diagnosis Date   Arthritis    Cancer (HCC)    skin ca   Duodenal ulcer    Gastric ulcer    Past Surgical History:  Procedure Laterality Date   ABDOMINAL HYSTERECTOMY     CATARACT EXTRACTION, BILATERAL Bilateral 2012   ESOPHAGOGASTRODUODENOSCOPY (EGD) WITH PROPOFOL N/A 07/12/2014   Procedure: ESOPHAGOGASTRODUODENOSCOPY (EGD) WITH PROPOFOL;  Surgeon: Scot Jun, MD;  Location: [facility] ENDOSCOPY;  Service: Endoscopy;  Laterality: N/A;   JOINT REPLACEMENT Right    THR   LAPAROSCOPIC BILATERAL SALPINGO OOPHERECTOMY Bilateral 01/10/2017   Procedure: LAPAROSCOPIC BILATERAL SALPINGO OOPHORECTOMY;  Surgeon: Christeen Douglas, MD;  Location: [facility] ORS;  Service: Gynecology;  Laterality: Bilateral;   PERIPHERAL VASCULAR CATHETERIZATION N/A 07/24/2014   Procedure: IVC Filter Removal;  Surgeon: Renford Dills, MD;  Location: [facility] INVASIVE CV LAB;  Service: Cardiovascular;  Laterality: N/A;   Family History  Problem Relation Age of Onset   Leukemia Mother    Diabetes Mother    Heart disease Father    Diabetes Father    Social History   Socioeconomic History   Marital status: Widowed    Spouse name: Not on file   Number of children: 1   Years of education: Not on file   Highest education level: Not on file  Occupational History  Occupation: retired  Tobacco Use   Smoking status: Never   Smokeless tobacco: Never  Vaping Use   Vaping Use: Never used  Substance and Sexual Activity   Alcohol use: No   Drug use: No   Sexual activity: Not on file  Other Topics Concern   Not on file  Social History Narrative   Not on file   Social Determinants of Health   Financial Resource Strain: Low Risk  (07/16/2022)   Overall Financial Resource Strain (CARDIA)    Difficulty of Paying Living Expenses: Not hard at all  Food Insecurity: No Food Insecurity (07/16/2022)   Hunger Vital Sign    Worried About  Running Out of Food in the Last Year: Never true    Ran Out of Food in the Last Year: Never true  Transportation Needs: No Transportation Needs (07/16/2022)   PRAPARE - Administrator, Civil Service (Medical): No    Lack of Transportation (Non-Medical): No  Physical Activity: Inactive (07/16/2022)   Exercise Vital Sign    Days of Exercise per Week: 0 days    Minutes of Exercise per Session: 0 min  Stress: No Stress Concern Present (07/16/2022)   [employer] of Occupational Health - Occupational Stress Questionnaire    Feeling of Stress : Only a little  Social Connections: Socially Isolated (07/16/2022)   Social Connection and Isolation Panel [NHANES]    Frequency of Communication with Friends and Family: Once a week    Frequency of Social Gatherings with Friends and Family: Never    Attends Religious Services: Never    Database administrator or Organizations: No    Attends Banker Meetings: Never    Marital Status: Widowed    Tobacco Counseling Counseling given: Not Answered   Clinical Intake:  Pre-visit preparation completed: Yes  Pain : 0-10 Pain Score: 5  Pain Type: Chronic pain Pain Location: Knee Pain Orientation: Left Pain Radiating Towards: arthritis     Nutritional Risks: None Diabetes: No  How often do you need to have someone help you when you read instructions, pamphlets, or other written materials from your doctor or pharmacy?: 1 - Never  Diabetic?no  Interpreter Needed?: No  Information entered by :: Kennedy Bucker, LPN   Activities of Daily Living    07/16/2022   11:12 AM 04/08/2022    2:16 PM  In your present state of health, do you have any difficulty performing the following activities:  Hearing? 0 1  Vision? 1 1  Difficulty concentrating or making decisions? 0 1  Walking or climbing stairs? 1 1  Dressing or bathing? 0 0  Doing errands, shopping? 1 1  Preparing Food and eating ? N   Using the Toilet? N   In the past  six months, have you accidently leaked urine? N   Do you have problems with loss of bowel control? N   Managing your Medications? N   Managing your Finances? N   Housekeeping or managing your Housekeeping? Y     Patient Care Team: Lorre Munroe, NP as PCP - General (Internal Medicine)  Indicate any recent Medical Services you may have received from other than [facility] providers in the past year (date may be approximate).     Assessment:   This is a routine wellness examination for Doyle.  Hearing/Vision screen Hearing Screening - Comments:: No aids Vision Screening - Comments:: Readers, has lens implants- Dr.Woodard  Dietary issues and exercise activities discussed: Current Exercise Habits:  The patient does not participate in regular exercise at present   Goals Addressed             This Visit's Progress    DIET - EAT MORE FRUITS AND VEGETABLES         Depression Screen    07/16/2022   11:05 AM 04/08/2022    2:15 PM 07/09/2021    2:38 PM 03/24/2021    2:41 PM  PHQ 2/9 Scores  PHQ - 2 Score 0 6 0 2  PHQ- 9 Score 0 15  4    Fall Risk    07/16/2022   11:11 AM 04/08/2022    2:16 PM 07/09/2021    2:39 PM 07/09/2021    2:38 PM  Fall Risk   Falls in the past year? 0 0 0 0  Number falls in past yr: 0  0 0  Injury with Fall? 0 0 0 0  Risk for fall due to : No Fall Risks Impaired balance/gait No Fall Risks No Fall Risks  Follow up Falls prevention discussed;Falls evaluation completed  Falls evaluation completed Falls evaluation completed    FALL RISK PREVENTION PERTAINING TO THE HOME:  Any stairs in or around the home? Yes  If so, are there any without handrails? No  Home free of loose throw rugs in walkways, pet beds, electrical cords, etc? Yes  Adequate lighting in your home to reduce risk of falls? Yes   ASSISTIVE DEVICES UTILIZED TO PREVENT FALLS:  Life alert? No  Use of a cane, walker or w/c? Yes cane Grab bars in the bathroom? No  Shower chair or bench in  shower? No  Elevated toilet seat or a handicapped toilet? No    Cognitive Function:        07/16/2022   11:14 AM 07/09/2021    2:22 PM  6CIT Screen  What Year? 0 points 0 points  What month? 0 points 0 points  What time? 0 points 0 points  Count back from 20 0 points 0 points  Months in reverse 0 points 0 points  Repeat phrase 0 points 0 points  Total Score 0 points 0 points    Immunizations Immunization History  Administered Date(s) Administered   Fluad Quad(high Dose 65+) 10/19/2018, 10/26/2021   H1N1 10/23/2014   Influenza, High Dose Seasonal PF 10/14/2015, 10/28/2016, 11/01/2017   Influenza-Unspecified 11/06/2011, 11/15/2013, 10/23/2014, 10/14/2015, 10/17/2015   PFIZER Comirnaty(Gray Top)Covid-19 Tri-Sucrose Vaccine 05/26/2019, 06/16/2019   Pneumococcal Conjugate-13 10/17/2015   Pneumococcal Polysaccharide-23 11/29/2018   Tdap 08/13/2010   Zoster, Live 10/08/2008    TDAP status: Due, Education has been provided regarding the importance of this vaccine. Advised may receive this vaccine at local pharmacy or Health Dept. Aware to provide a copy of the vaccination record if obtained from local pharmacy or Health Dept. Verbalized acceptance and understanding.  Flu Vaccine status: Up to date  Pneumococcal vaccine status: Up to date  Covid-19 vaccine status: Completed vaccines  Qualifies for Shingles Vaccine? Yes   Zostavax completed Yes   Shingrix Completed?: No.    Education has been provided regarding the importance of this vaccine. Patient has been advised to call insurance company to determine out of pocket expense if they have not yet received this vaccine. Advised may also receive vaccine at local pharmacy or Health Dept. Verbalized acceptance and understanding.  Screening Tests Health Maintenance  Topic Date Due   Zoster Vaccines- Shingrix (1 of 2) Never done   DTaP/Tdap/Td (2 - Td or Tdap)  08/12/2020   COVID-19 Vaccine (3 - Pfizer risk series) 10/10/2022  (Originally 07/14/2019)   INFLUENZA VACCINE  09/09/2022   Medicare Annual Wellness (AWV)  07/16/2023   Pneumonia Vaccine 79+ Years old  Completed   DEXA SCAN  Completed   HPV VACCINES  Aged Out    Health Maintenance  Health Maintenance Due  Topic Date Due   Zoster Vaccines- Shingrix (1 of 2) Never done   DTaP/Tdap/Td (2 - Td or Tdap) 08/12/2020    Colorectal cancer screening: No longer required.   Mammogram status: No longer required due to age.   Lung Cancer Screening: (Low Dose CT Chest recommended if Age 39-80 years, 30 pack-year currently smoking OR have quit w/in 15years.) does not qualify.    Additional Screening:  Hepatitis C Screening: does not qualify; Completed no  Vision Screening: Recommended annual ophthalmology exams for early detection of glaucoma and other disorders of the eye. Is the patient up to date with their annual eye exam?  Yes  Who is the provider or what is the name of the office in which the patient attends annual eye exams? Dr.Woodard If pt is not established with a provider, would they like to be referred to a provider to establish care? No .   Dental Screening: Recommended annual dental exams for proper oral hygiene  Community Resource Referral / Chronic Care Management: CRR required this visit?  No   CCM required this visit?  No      Plan:     I have personally reviewed and noted the following in the patient's chart:   Medical and social history Use of alcohol, tobacco or illicit drugs  Current medications and supplements including opioid prescriptions. Patient is not currently taking opioid prescriptions. Functional ability and status Nutritional status Physical activity Advanced directives List of other physicians Hospitalizations, surgeries, and ER visits in previous 12 months Vitals Screenings to include cognitive, depression, and falls Referrals and appointments  In addition, I have reviewed and discussed with patient  certain preventive protocols, quality metrics, and best practice recommendations. A written personalized care plan for preventive services as well as general preventive health recommendations were provided to patient.     Hal Hope, LPN   08/16/2954   Nurse Notes: none

## 2022-07-16 NOTE — Patient Instructions (Signed)
Brandy Orr , Thank you for taking time to come for your Medicare Wellness Visit. I appreciate your ongoing commitment to your health goals. Please review the following plan we discussed and let me know if I can assist you in the future.   These are the goals we discussed:  Goals      DIET - EAT MORE FRUITS AND VEGETABLES        This is a list of the screening recommended for you and due dates:  Health Maintenance  Topic Date Due   Zoster (Shingles) Vaccine (1 of 2) Never done   DTaP/Tdap/Td vaccine (2 - Td or Tdap) 08/12/2020   COVID-19 Vaccine (3 - Pfizer risk series) 10/10/2022*   Flu Shot  09/09/2022   Medicare Annual Wellness Visit  07/16/2023   Pneumonia Vaccine  Completed   DEXA scan (bone density measurement)  Completed   HPV Vaccine  Aged Out  *Topic was postponed. The date shown is not the original due date.    Advanced directives: no  Conditions/risks identified: none  Next appointment: Follow up in one year for your annual wellness visit 07/22/23 @ 10:15 am by phone   Preventive Care 65 Years and Older, Female Preventive care refers to lifestyle choices and visits with your health care provider that can promote health and wellness. What does preventive care include? A yearly physical exam. This is also called an annual well check. Dental exams once or twice a year. Routine eye exams. Ask your health care provider how often you should have your eyes checked. Personal lifestyle choices, including: Daily care of your teeth and gums. Regular physical activity. Eating a healthy diet. Avoiding tobacco and drug use. Limiting alcohol use. Practicing safe sex. Taking low-dose aspirin every day. Taking vitamin and mineral supplements as recommended by your health care provider. What happens during an annual well check? The services and screenings done by your health care provider during your annual well check will depend on your age, overall health, lifestyle risk  factors, and family history of disease. Counseling  Your health care provider may ask you questions about your: Alcohol use. Tobacco use. Drug use. Emotional well-being. Home and relationship well-being. Sexual activity. Eating habits. History of falls. Memory and ability to understand (cognition). Work and work Astronomer. Reproductive health. Screening  You may have the following tests or measurements: Height, weight, and BMI. Blood pressure. Lipid and cholesterol levels. These may be checked every 5 years, or more frequently if you are over 35 years old. Skin check. Lung cancer screening. You may have this screening every year starting at age 79 if you have a 30-pack-year history of smoking and currently smoke or have quit within the past 15 years. Fecal occult blood test (FOBT) of the stool. You may have this test every year starting at age 5. Flexible sigmoidoscopy or colonoscopy. You may have a sigmoidoscopy every 5 years or a colonoscopy every 10 years starting at age 6. Hepatitis C blood test. Hepatitis B blood test. Sexually transmitted disease (STD) testing. Diabetes screening. This is done by checking your blood sugar (glucose) after you have not eaten for a while (fasting). You may have this done every 1-3 years. Bone density scan. This is done to screen for osteoporosis. You may have this done starting at age 55. Mammogram. This may be done every 1-2 years. Talk to your health care provider about how often you should have regular mammograms. Talk with your health care provider about your test results,  treatment options, and if necessary, the need for more tests. Vaccines  Your health care provider may recommend certain vaccines, such as: Influenza vaccine. This is recommended every year. Tetanus, diphtheria, and acellular pertussis (Tdap, Td) vaccine. You may need a Td booster every 10 years. Zoster vaccine. You may need this after age 62. Pneumococcal 13-valent  conjugate (PCV13) vaccine. One dose is recommended after age 22. Pneumococcal polysaccharide (PPSV23) vaccine. One dose is recommended after age 47. Talk to your health care provider about which screenings and vaccines you need and how often you need them. This information is not intended to replace advice given to you by your health care provider. Make sure you discuss any questions you have with your health care provider. Document Released: 02/21/2015 Document Revised: 10/15/2015 Document Reviewed: 11/26/2014 Elsevier Interactive Patient Education  2017 [employer] Prevention in the Home Falls can cause injuries. They can happen to people of all ages. There are many things you can do to make your home safe and to help prevent falls. What can I do on the outside of my home? Regularly fix the edges of walkways and driveways and fix any cracks. Remove anything that might make you trip as you walk through a door, such as a raised step or threshold. Trim any bushes or trees on the path to your home. Use bright outdoor lighting. Clear any walking paths of anything that might make someone trip, such as rocks or tools. Regularly check to see if handrails are loose or broken. Make sure that both sides of any steps have handrails. Any raised decks and porches should have guardrails on the edges. Have any leaves, snow, or ice cleared regularly. Use sand or salt on walking paths during winter. Clean up any spills in your garage right away. This includes oil or grease spills. What can I do in the bathroom? Use night lights. Install grab bars by the toilet and in the tub and shower. Do not use towel bars as grab bars. Use non-skid mats or decals in the tub or shower. If you need to sit down in the shower, use a plastic, non-slip stool. Keep the floor dry. Clean up any water that spills on the floor as soon as it happens. Remove soap buildup in the tub or shower regularly. Attach bath mats  securely with double-sided non-slip rug tape. Do not have throw rugs and other things on the floor that can make you trip. What can I do in the bedroom? Use night lights. Make sure that you have a light by your bed that is easy to reach. Do not use any sheets or blankets that are too big for your bed. They should not hang down onto the floor. Have a firm chair that has side arms. You can use this for support while you get dressed. Do not have throw rugs and other things on the floor that can make you trip. What can I do in the kitchen? Clean up any spills right away. Avoid walking on wet floors. Keep items that you use a lot in easy-to-reach places. If you need to reach something above you, use a strong step stool that has a grab bar. Keep electrical cords out of the way. Do not use floor polish or wax that makes floors slippery. If you must use wax, use non-skid floor wax. Do not have throw rugs and other things on the floor that can make you trip. What can I do with my stairs? Do  not leave any items on the stairs. Make sure that there are handrails on both sides of the stairs and use them. Fix handrails that are broken or loose. Make sure that handrails are as long as the stairways. Check any carpeting to make sure that it is firmly attached to the stairs. Fix any carpet that is loose or worn. Avoid having throw rugs at the top or bottom of the stairs. If you do have throw rugs, attach them to the floor with carpet tape. Make sure that you have a light switch at the top of the stairs and the bottom of the stairs. If you do not have them, ask someone to add them for you. What else can I do to help prevent falls? Wear shoes that: Do not have high heels. Have rubber bottoms. Are comfortable and fit you well. Are closed at the toe. Do not wear sandals. If you use a stepladder: Make sure that it is fully opened. Do not climb a closed stepladder. Make sure that both sides of the stepladder  are locked into place. Ask someone to hold it for you, if possible. Clearly mark and make sure that you can see: Any grab bars or handrails. First and last steps. Where the edge of each step is. Use tools that help you move around (mobility aids) if they are needed. These include: Canes. Walkers. Scooters. Crutches. Turn on the lights when you go into a dark area. Replace any light bulbs as soon as they burn out. Set up your furniture so you have a clear path. Avoid moving your furniture around. If any of your floors are uneven, fix them. If there are any pets around you, be aware of where they are. Review your medicines with your doctor. Some medicines can make you feel dizzy. This can increase your chance of falling. Ask your doctor what other things that you can do to help prevent falls. This information is not intended to replace advice given to you by your health care provider. Make sure you discuss any questions you have with your health care provider. Document Released: 11/21/2008 Document Revised: 07/03/2015 Document Reviewed: 03/01/2014 Elsevier Interactive Patient Education  2017 [employer].

## 2022-10-06 ENCOUNTER — Other Ambulatory Visit: Payer: Self-pay | Admitting: Internal Medicine

## 2022-10-06 NOTE — Telephone Encounter (Unsigned)
Copied from CRM [id number]. Topic: General - Other >> Oct 06, 2022  9:20 AM Everette C wrote: Reason for CRM: Medication Refill - Medication: traMADol-acetaminophen (ULTRACET) 37.5-325 MG tablet [[id number]]  Has the patient contacted their pharmacy? Yes.   (Agent: If no, request that the patient contact the pharmacy for the refill. If patient does not wish to contact the pharmacy document the reason why and proceed with request.) (Agent: If yes, when and what did the pharmacy advise?)  Preferred Pharmacy (with phone number or street name): [id number] [place], [place] - [address] AT [facility] OF [address] [place] [address] [place] [address] Phone: [phone] Fax: [phone] Hours: Not open 24 hours   Has the patient been seen for an appointment in the last year OR does the patient have an upcoming appointment? Yes.    Agent: Please be advised that RX refills may take up to 3 business days. We ask that you follow-up with your pharmacy.

## 2022-10-07 MED ORDER — TRAMADOL-ACETAMINOPHEN 37.5-325 MG PO TABS: 1.0000 | ORAL_TABLET | Freq: Three times a day (TID) | ORAL | 0 refills | Status: DC | PRN

## 2022-10-07 NOTE — Telephone Encounter (Signed)
Requested medication (s) are due for refill today: Yes  Requested medication (s) are on the active medication list: Yes  Last refill:  04/12/22  Future visit scheduled: Yes  Notes to clinic:  Not delegated.    Requested Prescriptions  Pending Prescriptions Disp Refills   traMADol-acetaminophen (ULTRACET) 37.5-325 MG tablet 30 tablet 0    Sig: Take 1 tablet by mouth every 8 (eight) hours as needed for moderate pain.     Not Delegated - Analgesics:  Opioid Agonist Combinations - acetaminophen / tramadol Failed - 10/06/2022 10:47 AM      Failed - This refill cannot be delegated      Failed - Cr in normal range and within 360 days    Creat  Date Value Ref Range Status  04/13/2022 1.17 (H) 0.60 - 0.95 mg/dL Final         Failed - Urine Drug Screen completed in last 360 days      Passed - AST in normal range and within 360 days    AST  Date Value Ref Range Status  04/13/2022 16 10 - 35 U/L Final         Passed - ALT in normal range and within 360 days    ALT  Date Value Ref Range Status  04/13/2022 11 6 - 29 U/L Final         Passed - Valid encounter within last 3 months    Recent Outpatient Visits           6 months ago Pure hypercholesterolemia   [facility] [place], Salvadore Oxford, NP   1 year ago Encounter for general adult medical examination with abnormal findings   [place] [facility] [place], Salvadore Oxford, NP   1 year ago Aortic atherosclerosis [facility])   [place] [facility] [place], Salvadore Oxford, NP       Future Appointments             In 1 week Sampson Si, Salvadore Oxford, NP [place] [facility], [facility]

## 2022-10-14 ENCOUNTER — Ambulatory Visit (INDEPENDENT_AMBULATORY_CARE_PROVIDER_SITE_OTHER): Payer: Medicare PPO | Admitting: Internal Medicine

## 2022-10-14 ENCOUNTER — Encounter: Payer: Self-pay | Admitting: Internal Medicine

## 2022-10-14 VITALS — BP 136/72 | HR 80 | Temp 97.1°F | Wt 155.0 lb

## 2022-10-14 DIAGNOSIS — R1031 Right lower quadrant pain: Secondary | ICD-10-CM | POA: Diagnosis not present

## 2022-10-14 DIAGNOSIS — R7309 Other abnormal glucose: Secondary | ICD-10-CM | POA: Diagnosis not present

## 2022-10-14 DIAGNOSIS — Z23 Encounter for immunization: Secondary | ICD-10-CM | POA: Diagnosis not present

## 2022-10-14 DIAGNOSIS — E7800 Pure hypercholesterolemia, unspecified: Secondary | ICD-10-CM | POA: Diagnosis not present

## 2022-10-14 DIAGNOSIS — Z0001 Encounter for general adult medical examination with abnormal findings: Secondary | ICD-10-CM | POA: Diagnosis not present

## 2022-10-14 NOTE — Patient Instructions (Signed)
Health Maintenance for Postmenopausal Women Menopause is a normal process in which your ability to get pregnant comes to an end. This process happens slowly over many months or years, usually between the ages of 48 and 55. Menopause is complete when you have missed your menstrual period for 12 months. It is important to talk with your health care provider about some of the most common conditions that affect women after menopause (postmenopausal women). These include heart disease, cancer, and bone loss (osteoporosis). Adopting a healthy lifestyle and getting preventive care can help to promote your health and wellness. The actions you take can also lower your chances of developing some of these common conditions. What are the signs and symptoms of menopause? During menopause, you may have the following symptoms: Hot flashes. These can be moderate or severe. Night sweats. Decrease in sex drive. Mood swings. Headaches. Tiredness (fatigue). Irritability. Memory problems. Problems falling asleep or staying asleep. Talk with your health care provider about treatment options for your symptoms. Do I need hormone replacement therapy? Hormone replacement therapy is effective in treating symptoms that are caused by menopause, such as hot flashes and night sweats. Hormone replacement carries certain risks, especially as you become older. If you are thinking about using estrogen or estrogen with progestin, discuss the benefits and risks with your health care provider. How can I reduce my risk for heart disease and stroke? The risk of heart disease, heart attack, and stroke increases as you age. One of the causes may be a change in the body's hormones during menopause. This can affect how your body uses dietary fats, triglycerides, and cholesterol. Heart attack and stroke are medical emergencies. There are many things that you can do to help prevent heart disease and stroke. Watch your blood pressure High  blood pressure causes heart disease and increases the risk of stroke. This is more likely to develop in people who have high blood pressure readings or are overweight. Have your blood pressure checked: Every 3-5 years if you are 18-39 years of age. Every year if you are 40 years old or older. Eat a healthy diet  Eat a diet that includes plenty of vegetables, fruits, low-fat dairy products, and lean protein. Do not eat a lot of foods that are high in solid fats, added sugars, or sodium. Get regular exercise Get regular exercise. This is one of the most important things you can do for your health. Most adults should: Try to exercise for at least 150 minutes each week. The exercise should increase your heart rate and make you sweat (moderate-intensity exercise). Try to do strengthening exercises at least twice each week. Do these in addition to the moderate-intensity exercise. Spend less time sitting. Even light physical activity can be beneficial. Other tips Work with your health care provider to achieve or maintain a healthy weight. Do not use any products that contain nicotine or tobacco. These products include cigarettes, chewing tobacco, and vaping devices, such as e-cigarettes. If you need help quitting, ask your health care provider. Know your numbers. Ask your health care provider to check your cholesterol and your blood sugar (glucose). Continue to have your blood tested as directed by your health care provider. Do I need screening for cancer? Depending on your health history and family history, you may need to have cancer screenings at different stages of your life. This may include screening for: Breast cancer. Cervical cancer. Lung cancer. Colorectal cancer. What is my risk for osteoporosis? After menopause, you may be   at increased risk for osteoporosis. Osteoporosis is a condition in which bone destruction happens more quickly than new bone creation. To help prevent osteoporosis or  the bone fractures that can happen because of osteoporosis, you may take the following actions: If you are 19-50 years old, get at least 1,000 mg of calcium and at least 600 international units (IU) of vitamin D per day. If you are older than age 50 but younger than age 70, get at least 1,200 mg of calcium and at least 600 international units (IU) of vitamin D per day. If you are older than age 70, get at least 1,200 mg of calcium and at least 800 international units (IU) of vitamin D per day. Smoking and drinking excessive alcohol increase the risk of osteoporosis. Eat foods that are rich in calcium and vitamin D, and do weight-bearing exercises several times each week as directed by your health care provider. How does menopause affect my mental health? Depression may occur at any age, but it is more common as you become older. Common symptoms of depression include: Feeling depressed. Changes in sleep patterns. Changes in appetite or eating patterns. Feeling an overall lack of motivation or enjoyment of activities that you previously enjoyed. Frequent crying spells. Talk with your health care provider if you think that you are experiencing any of these symptoms. General instructions See your health care provider for regular wellness exams and vaccines. This may include: Scheduling regular health, dental, and eye exams. Getting and maintaining your vaccines. These include: Influenza vaccine. Get this vaccine each year before the flu season begins. Pneumonia vaccine. Shingles vaccine. Tetanus, diphtheria, and pertussis (Tdap) booster vaccine. Your health care provider may also recommend other immunizations. Tell your health care provider if you have ever been abused or do not feel safe at home. Summary Menopause is a normal process in which your ability to get pregnant comes to an end. This condition causes hot flashes, night sweats, decreased interest in sex, mood swings, headaches, or lack  of sleep. Treatment for this condition may include hormone replacement therapy. Take actions to keep yourself healthy, including exercising regularly, eating a healthy diet, watching your weight, and checking your blood pressure and blood sugar levels. Get screened for cancer and depression. Make sure that you are up to date with all your vaccines. This information is not intended to replace advice given to you by your health care provider. Make sure you discuss any questions you have with your health care provider. Document Revised: 06/16/2020 Document Reviewed: 06/16/2020 Elsevier Patient Education  2024 Elsevier Inc.  

## 2022-10-14 NOTE — Progress Notes (Signed)
Subjective:    Patient ID: Brandy Orr, female    DOB: 19-Jul-1932, 89 y.o.   MRN: [id number]  HPI  Patient presents to clinic today for her annual exam.  Flu: 10/2021 Tetanus: 08/2010 COVID: X 2 Pneumovax: 11/2018 Prevnar: 10/2015 Zostavax: 09/2015 Shingrix: Never Pap smear: Hysterectomy Mammogram: No longer screening Bone density: >2 years ago Colon screening: No longer screening Vision screening: Annually Dentist: Dentures  Diet: She does eat meat.  She consumes fruits vegetables.  She tries avoid fried foods.  She drinks mostly water and Pepsi Exercise: None   Review of Systems  Past Medical History:  Diagnosis Date   Arthritis    Cancer (HCC)    skin ca   Duodenal ulcer    Gastric ulcer     Current Outpatient Medications  Medication Sig Dispense Refill   acetaminophen (TYLENOL) 650 MG CR tablet Take 650-1,300 mg by mouth every 8 (eight) hours as needed for pain.      aspirin EC 81 MG tablet Take 1 tablet (81 mg total) by mouth daily. Swallow whole. 30 tablet 12   Glucosamine-Chondroitin 500-400 MG CAPS Take by mouth.     pantoprazole (PROTONIX) 20 MG tablet Take 20 mg by mouth daily.     polyethylene glycol (MIRALAX / GLYCOLAX) 17 g packet Take 17 g by mouth daily.     traMADol-acetaminophen (ULTRACET) 37.5-325 MG tablet Take 1 tablet by mouth every 8 (eight) hours as needed for moderate pain. 30 tablet 0   No current facility-administered medications for this visit.    Allergies  Allergen Reactions   Oxycodone Nausea And Vomiting   Hydrocodone Nausea And Vomiting    Family History  Problem Relation Age of Onset   Leukemia Mother    Diabetes Mother    Heart disease Father    Diabetes Father     Social History   Socioeconomic History   Marital status: Widowed    Spouse name: Not on file   Number of children: 1   Years of education: Not on file   Highest education level: Not on file  Occupational History   Occupation: retired  Tobacco  Use   Smoking status: Never   Smokeless tobacco: Never  Vaping Use   Vaping status: Never Used  Substance and Sexual Activity   Alcohol use: No   Drug use: No   Sexual activity: Not on file  Other Topics Concern   Not on file  Social History Narrative   Not on file   Social Determinants of Health   Financial Resource Strain: Low Risk  (07/16/2022)   Overall Financial Resource Strain (CARDIA)    Difficulty of Paying Living Expenses: Not hard at all  Food Insecurity: No Food Insecurity (07/16/2022)   Hunger Vital Sign    Worried About Running Out of Food in the Last Year: Never true    Ran Out of Food in the Last Year: Never true  Transportation Needs: No Transportation Needs (07/16/2022)   PRAPARE - Administrator, Civil Service (Medical): No    Lack of Transportation (Non-Medical): No  Physical Activity: Inactive (07/16/2022)   Exercise Vital Sign    Days of Exercise per Week: 0 days    Minutes of Exercise per Session: 0 min  Stress: No Stress Concern Present (07/16/2022)   [employer] of Occupational Health - Occupational Stress Questionnaire    Feeling of Stress : Only a little  Social Connections: Socially Isolated (07/16/2022)   Social  Connection and Isolation Panel [NHANES]    Frequency of Communication with Friends and Family: Once a week    Frequency of Social Gatherings with Friends and Family: Never    Attends Religious Services: Never    Database administrator or Organizations: No    Attends Banker Meetings: Never    Marital Status: Widowed  Intimate Partner Violence: Not At Risk (07/16/2022)   Humiliation, Afraid, Rape, and Kick questionnaire    Fear of Current or Ex-Partner: No    Emotionally Abused: No    Physically Abused: No    Sexually Abused: No     Constitutional: Denies fever, malaise, fatigue, headache or abrupt weight changes.  HEENT: Denies eye pain, eye redness, ear pain, ringing in the ears, wax buildup, runny nose, nasal  congestion, bloody nose, or sore throat. Respiratory: Denies difficulty breathing, shortness of breath, cough or sputum production.   Cardiovascular: Denies chest pain, chest tightness, palpitations or swelling in the hands or feet.  Gastrointestinal: Patient reports right lower quadrant abdominal pain and distention.  Denies  constipation, diarrhea or blood in the stool.  GU: Denies urgency, frequency, pain with urination, burning sensation, blood in urine, odor or discharge. Musculoskeletal: Patient reports joint pain.  Denies decrease in range of motion, difficulty with gait, muscle pain or joint swelling.  Skin: Denies redness, rashes, lesions or ulcercations.  Neurological: Pt reports difficulty with balance, insomnia. Denies dizziness, difficulty with memory, difficulty with speech or problems with coordination.  Psych: Denies anxiety, depression, SI/HI.  No other specific complaints in a complete review of systems (except as listed in HPI above).     Objective:   Physical Exam  BP 136/72 (BP Location: Left Arm, Patient Position: Sitting, Cuff Size: Normal)   Pulse 80   Temp (!) 97.1 F (36.2 C) (Temporal)   Wt 155 lb (70.3 kg)   SpO2 96%   BMI 25.02 kg/m   Wt Readings from Last 3 Encounters:  07/16/22 153 lb (69.4 kg)  04/08/22 153 lb (69.4 kg)  10/05/21 156 lb (70.8 kg)    General: Appears her stated age, well developed, well nourished in NAD. Skin: Warm, dry and intact.  HEENT: Head: normal shape and size; Eyes: sclera white, no icterus, conjunctiva pink, PERRLA and EOMs intact;  Neck:  Neck supple, trachea midline. No masses, lumps or thyromegaly present.  Cardiovascular: Normal rate and rhythm. S1,S2 noted.  No murmur, rubs or gallops noted. No JVD.  1+ nonpitting BLE edema. No carotid bruits noted. Pulmonary/Chest: Normal effort and positive vesicular breath sounds. No respiratory distress. No wheezes, rales or ronchi noted.  Abdomen: Soft and nontender.  Mild  distention noted in the right lower quadrant.  No distinct as noted.  Normal bowel sounds.  Musculoskeletal: Gait slow and steady with use of cane. Neurological: Alert and oriented. Cranial nerves II-XII grossly intact. Coordination normal.  Psychiatric: Mood and affect normal. Behavior is normal. Judgment and thought content normal.     BMET    Component Value Date/Time   NA 140 04/13/2022 1028   NA 139 03/11/2014 1411   K 3.8 04/13/2022 1028   K 3.6 03/11/2014 1411   CL 104 04/13/2022 1028   CL 103 03/11/2014 1411   CO2 25 04/13/2022 1028   CO2 29 03/11/2014 1411   GLUCOSE 113 (H) 04/13/2022 1028   GLUCOSE 79 03/11/2014 1411   BUN 20 04/13/2022 1028   BUN 15 03/11/2014 1411   CREATININE 1.17 (H) 04/13/2022 1028  CALCIUM 9.2 04/13/2022 1028   CALCIUM 9.0 03/11/2014 1411   GFRNONAA >60 01/04/2017 0839   GFRNONAA >60 03/11/2014 1411   GFRAA >60 01/04/2017 0839   GFRAA >60 03/11/2014 1411    Lipid Panel     Component Value Date/Time   CHOL 228 (H) 04/13/2022 1028   TRIG 147 04/13/2022 1028   HDL 76 04/13/2022 1028   CHOLHDL 3.0 04/13/2022 1028   LDLCALC 126 (H) 04/13/2022 1028    CBC    Component Value Date/Time   WBC 6.6 04/13/2022 1028   RBC 3.75 (L) 04/13/2022 1028   HGB 12.7 04/13/2022 1028   HGB 12.7 03/11/2014 1411   HCT 36.8 04/13/2022 1028   HCT 37.3 03/11/2014 1411   PLT 318 04/13/2022 1028   PLT 341 03/11/2014 1411   MCV 98.1 04/13/2022 1028   MCV 97 03/11/2014 1411   MCH 33.9 (H) 04/13/2022 1028   MCHC 34.5 04/13/2022 1028   RDW 12.3 04/13/2022 1028   RDW 13.4 03/11/2014 1411    Hgb A1C Lab Results  Component Value Date   HGBA1C 5.5 04/13/2022           Assessment & Plan:   Preventative health maintenance:  Flu shot today She declines tetanus for financial reasons, advised her if she gets bit or cut to go get this done Encouraged her to get her COVID booster Pneumovax and Prevnar UTD Zostavax UTD Discussed Shingrix vaccine, she  will check coverage with her insurance company and schedule visit if she would like to have this done She no longer needs to screen for cervical cancer She no longer needs to screen for breast cancer Bone density declined She no longer needs to screen for colon cancer Encouraged her to consume a balanced diet and exercise regimen Advised her to see an eye doctor and dentist annually We will check CBC, c-Met, lipid, A1c today  RLQ abdominal pain and distention:  CT abdomen/pelvis ordered for further evaluation of symptoms  RTC in 6 months, follow-up chronic conditions Nicki Reaper, NP

## 2022-10-18 ENCOUNTER — Other Ambulatory Visit: Payer: Medicare PPO

## 2022-10-19 ENCOUNTER — Ambulatory Visit: Payer: Medicare PPO

## 2022-10-19 ENCOUNTER — Other Ambulatory Visit: Payer: Medicare PPO

## 2022-10-19 DIAGNOSIS — Z0001 Encounter for general adult medical examination with abnormal findings: Secondary | ICD-10-CM | POA: Diagnosis not present

## 2022-10-19 DIAGNOSIS — E7800 Pure hypercholesterolemia, unspecified: Secondary | ICD-10-CM | POA: Diagnosis not present

## 2022-10-19 DIAGNOSIS — R7309 Other abnormal glucose: Secondary | ICD-10-CM | POA: Diagnosis not present

## 2022-10-20 LAB — CBC
HCT: 37.9 % (ref 35.0–45.0)
Hemoglobin: 12.6 g/dL (ref 11.7–15.5)
MCH: 33 pg (ref 27.0–33.0)
MCHC: 33.2 g/dL (ref 32.0–36.0)
MCV: 99.2 fL (ref 80.0–100.0)
MPV: 10.1 fL (ref 7.5–12.5)
Platelets: 309 10*3/uL (ref 140–400)
RBC: 3.82 10*6/uL (ref 3.80–5.10)
RDW: 12.3 % (ref 11.0–15.0)
WBC: 6.9 10*3/uL (ref 3.8–10.8)

## 2022-10-20 LAB — LIPID PANEL
Cholesterol: 228 mg/dL — ABNORMAL HIGH (ref <200)
HDL: 71 mg/dL (ref >=50)
LDL Cholesterol (Calc): 135 mg/dL — ABNORMAL HIGH
Non-HDL Cholesterol (Calc): 157 mg/dL — ABNORMAL HIGH (ref <130)
Total CHOL/HDL Ratio: 3.2 (calc) (ref <5.0)
Triglycerides: 114 mg/dL (ref <150)

## 2022-10-20 LAB — COMPLETE METABOLIC PANEL WITH GFR
AG Ratio: 1.8 (calc) (ref 1.0–2.5)
ALT: 6 U/L (ref 6–29)
AST: 13 U/L (ref 10–35)
Albumin: 4.1 g/dL (ref 3.6–5.1)
Alkaline phosphatase (APISO): 53 U/L (ref 37–153)
BUN/Creatinine Ratio: 24 (calc) — ABNORMAL HIGH (ref 6–22)
BUN: 23 mg/dL (ref 7–25)
CO2: 26 mmol/L (ref 20–32)
Calcium: 9.6 mg/dL (ref 8.6–10.4)
Chloride: 107 mmol/L (ref 98–110)
Creat: 0.97 mg/dL — ABNORMAL HIGH (ref 0.60–0.95)
Globulin: 2.3 g/dL (ref 1.9–3.7)
Glucose, Bld: 95 mg/dL (ref 65–139)
Potassium: 4.1 mmol/L (ref 3.5–5.3)
Sodium: 141 mmol/L (ref 135–146)
Total Bilirubin: 0.4 mg/dL (ref 0.2–1.2)
Total Protein: 6.4 g/dL (ref 6.1–8.1)
eGFR: 56 mL/min/{1.73_m2} — ABNORMAL LOW (ref >=60)

## 2022-10-20 LAB — HEMOGLOBIN A1C
Hgb A1c MFr Bld: 5.5 %{Hb} (ref <5.7)
Mean Plasma Glucose: 111 mg/dL
eAG (mmol/L): 6.2 mmol/L

## 2022-10-25 ENCOUNTER — Ambulatory Visit
Admission: RE | Admit: 2022-10-25 | Discharge: 2022-10-25 | Disposition: A | Discharge status: Home / Self Care | Payer: Medicare PPO | Source: Ambulatory Visit | Attending: Internal Medicine | Admitting: Internal Medicine

## 2022-10-25 DIAGNOSIS — R1031 Right lower quadrant pain: Secondary | ICD-10-CM | POA: Diagnosis not present

## 2022-10-25 DIAGNOSIS — K5730 Diverticulosis of large intestine without perforation or abscess without bleeding: Secondary | ICD-10-CM | POA: Diagnosis not present

## 2022-10-25 DIAGNOSIS — K8020 Calculus of gallbladder without cholecystitis without obstruction: Secondary | ICD-10-CM | POA: Diagnosis not present

## 2022-10-25 MED ORDER — IOHEXOL 300 MG/ML  SOLN
75.0000 mL | Freq: Once | INTRAMUSCULAR | Status: AC | PRN
  Administered 2022-10-25: 75 mL via INTRAVENOUS

## 2022-11-02 ENCOUNTER — Telehealth: Payer: Self-pay

## 2022-11-02 NOTE — Telephone Encounter (Signed)
Copied from CRM [id number]. Topic: General - Inquiry >> Nov 01, 2022  1:24 PM Payton Doughty wrote: Reason for CRM: pt would like results of her CT scan.  The results have not been released. call  [phone]

## 2022-11-02 NOTE — Telephone Encounter (Signed)
Results are not back yet.

## 2022-11-05 NOTE — Telephone Encounter (Signed)
The results are not available.  It is taking up to 2 weeks because the radiology department has been behind.

## 2022-11-05 NOTE — Telephone Encounter (Signed)
The niece was calling in on the patients behalf checking on the status of her CT results which they still have not received. The patient is getting frustrated. Please assist patient further

## 2022-11-24 ENCOUNTER — Telehealth: Payer: Self-pay

## 2022-11-24 NOTE — Telephone Encounter (Signed)
Pt given lab results per notes of Regina on 11/09/22. Pt verbalized understanding. Patient stated I will not take a baby aspirin, I do not trust it even if it is a small dose. I only take tylenol and I only take it at night when I can not sleep. Patient would like to know if PCP has any other recommendations for her.  The CT of your abdomen does not show any abnormality that is causing the swelling that you are describing.  Incidental findings on the CT are as follows: 1-diverticulosis of the colon without acute infection.  2-evidence of gallstones without gallbladder infection.  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  This is common with aging.  4-arthritic changes at L1-2.  5-aortic atherosclerosis which is plaque buildup in the aorta.  It is important that you take a baby aspirin daily.  Written by Lorre Munroe, NP on 11/09/2022  9:39 AM EDT

## 2022-11-25 NOTE — Telephone Encounter (Signed)
No further action needed at this time.

## 2022-12-01 NOTE — Telephone Encounter (Signed)
Patient has returned call- she is still concerned about the abdominal swelling and the cause. Reviewed results again and patient would like to speak with provider about next steps.

## 2022-12-06 ENCOUNTER — Ambulatory Visit: Payer: Medicare PPO | Admitting: Internal Medicine

## 2022-12-15 DIAGNOSIS — Z96649 Presence of unspecified artificial hip joint: Secondary | ICD-10-CM | POA: Diagnosis not present

## 2022-12-15 DIAGNOSIS — Z833 Family history of diabetes mellitus: Secondary | ICD-10-CM | POA: Diagnosis not present

## 2022-12-15 DIAGNOSIS — I129 Hypertensive chronic kidney disease with stage 1 through stage 4 chronic kidney disease, or unspecified chronic kidney disease: Secondary | ICD-10-CM | POA: Diagnosis not present

## 2022-12-15 DIAGNOSIS — K219 Gastro-esophageal reflux disease without esophagitis: Secondary | ICD-10-CM | POA: Diagnosis not present

## 2022-12-15 DIAGNOSIS — I700 Atherosclerosis of aorta: Secondary | ICD-10-CM | POA: Diagnosis not present

## 2022-12-15 DIAGNOSIS — G3184 Mild cognitive impairment, so stated: Secondary | ICD-10-CM | POA: Diagnosis not present

## 2022-12-15 DIAGNOSIS — R32 Unspecified urinary incontinence: Secondary | ICD-10-CM | POA: Diagnosis not present

## 2022-12-15 DIAGNOSIS — E785 Hyperlipidemia, unspecified: Secondary | ICD-10-CM | POA: Diagnosis not present

## 2022-12-15 DIAGNOSIS — M810 Age-related osteoporosis without current pathological fracture: Secondary | ICD-10-CM | POA: Diagnosis not present

## 2022-12-15 DIAGNOSIS — M1990 Unspecified osteoarthritis, unspecified site: Secondary | ICD-10-CM | POA: Diagnosis not present

## 2022-12-15 DIAGNOSIS — H9193 Unspecified hearing loss, bilateral: Secondary | ICD-10-CM | POA: Diagnosis not present

## 2022-12-15 DIAGNOSIS — N1831 Chronic kidney disease, stage 3a: Secondary | ICD-10-CM | POA: Diagnosis not present

## 2022-12-20 DIAGNOSIS — H1045 Other chronic allergic conjunctivitis: Secondary | ICD-10-CM | POA: Diagnosis not present

## 2022-12-20 DIAGNOSIS — D3131 Benign neoplasm of right choroid: Secondary | ICD-10-CM | POA: Diagnosis not present

## 2023-02-09 ENCOUNTER — Emergency Department: Payer: Medicare PPO

## 2023-02-09 ENCOUNTER — Other Ambulatory Visit: Payer: Self-pay

## 2023-02-09 ENCOUNTER — Emergency Department
Admission: EM | Admit: 2023-02-09 | Discharge: 2023-02-09 | Disposition: A | Discharge status: Home / Self Care | Payer: Medicare PPO | Attending: Emergency Medicine | Admitting: Emergency Medicine

## 2023-02-09 DIAGNOSIS — M25562 Pain in left knee: Secondary | ICD-10-CM | POA: Diagnosis not present

## 2023-02-09 DIAGNOSIS — I10 Essential (primary) hypertension: Secondary | ICD-10-CM | POA: Diagnosis not present

## 2023-02-09 DIAGNOSIS — M25462 Effusion, left knee: Secondary | ICD-10-CM | POA: Diagnosis present

## 2023-02-09 DIAGNOSIS — M1712 Unilateral primary osteoarthritis, left knee: Secondary | ICD-10-CM | POA: Insufficient documentation

## 2023-02-09 DIAGNOSIS — R609 Edema, unspecified: Secondary | ICD-10-CM | POA: Diagnosis not present

## 2023-02-09 MED ORDER — TRAMADOL HCL 50 MG PO TABS
50.0000 mg | ORAL_TABLET | ORAL | Status: AC
  Administered 2023-02-09: 50 mg via ORAL
  Filled 2023-02-09: qty 1

## 2023-02-09 NOTE — ED Triage Notes (Signed)
 Pt to ED [facility] from home for left knee swelling x2 weeks. Reports difficulty ambulating d/t swelling and pain.

## 2023-02-09 NOTE — ED Provider Notes (Signed)
 [facility] Provider Note    Event Date/Time   First MD Initiated Contact with Patient 02/09/23 0957     (approximate)   History   Left knee pain  HPI  Brandy Orr is a 89 y.o. female history of osteoarthritis.  No falls or injuries.  Reports that she has been having some swelling and achiness pain with walking on the left knee joint.  Feels like arthritis similar to what she is experienced in her right shoulder  No numbness or weakness no fevers not red.  The left knee joint has been slightly swollen.  It feels painful to bear weight on but when she is laying in bed or resting it there is no pain  Tramadol  that her primary doctor provides her for general pain as well as Tylenol  have not been particularly helpful in alleviating symptoms.  No numbness or weakness no cold or blue foot.  No injuries or falls.     Physical Exam   Triage Vital Signs: ED Triage Vitals  Encounter Vitals Group     BP 02/09/23 0952 126/89     Systolic BP Percentile --      Diastolic BP Percentile --      Pulse Rate 02/09/23 0952 89     Resp 02/09/23 0951 17     Temp 02/09/23 0951 98.3 F (36.8 C)     Temp src --      SpO2 02/09/23 0952 95 %     Weight 02/09/23 0951 180 lb (81.6 kg)     Height 02/09/23 0951 5' 6 (1.676 m)     Head Circumference --      Peak Flow --      Pain Score 02/09/23 0951 3     Pain Loc --      Pain Education --      Exclude from Growth Chart --     Most recent vital signs: Vitals:   02/09/23 0951 02/09/23 0952  BP:  126/89  Pulse:  89  Resp: 17   Temp: 98.3 F (36.8 C)   SpO2:  95%     General: Awake, no distress.  Currently pain-free resting comfortably in the bed. CV:  Good peripheral perfusion.  Resp:  Normal effort.  Abd:  No distention.  Other:  Strong palpable dorsalis pedis and posterior tibial pulses in the left foot.  Warm well-perfused.  No appreciable edema no venous cords or congestion.  The left lower  extremity has mild joint effusion about the left knee with no warmth or overlying erythema.  She relates that his pain-free and demonstrates good range of motion, but with axial loading it does produce some pain in the left knee joint.  Suspect a large burden of osteoarthritis, with arthritic appearance of the joint   ED Results / Procedures / Treatments   Labs (all labs ordered are listed, but only abnormal results are displayed) Labs Reviewed - No data to display   EKG     RADIOLOGY  Interpreted me as negative for fracture or significant burden of osteoarthritis   DG Knee Complete 4 Views Left Result Date: 02/09/2023 CLINICAL DATA:  Left knee pain and swelling 2 weeks. EXAM: LEFT KNEE - COMPLETE 4+ VIEW COMPARISON:  None Available. FINDINGS: Severe tricompartmental osteoarthritic changes present. Possible small joint effusion. No acute fracture or dislocation. Soft tissues are unremarkable. IMPRESSION: 1. No acute findings. 2. Severe tricompartmental osteoarthritis. Electronically Signed   By: Toribio Agreste M.D.  On: 02/09/2023 11:07      PROCEDURES:  Critical Care performed: No  Procedures   MEDICATIONS ORDERED IN ED: Medications  traMADol  (ULTRAM ) tablet 50 mg (50 mg Oral Given 02/09/23 1030)     IMPRESSION / MDM / ASSESSMENT AND PLAN / ED COURSE  I reviewed the triage vital signs and the nursing notes.                              Differential diagnosis includes, but is not limited to, osteoarthritis, less likely based on clinical exam and assessment crystal disease, musculoskeletal strain, meniscal injury, tear, etc.  No evidence of infection erythema or findings that would be suggestive of DVT, infected joint or septic process etc.  Patient's presentation is most consistent with acute complicated illness / injury requiring diagnostic workup.        Cussed with the patient as well as with her family including her brother at the bedside.  She has prescription  for tramadol  which she can take as needed from her primary.  They are questioning if we might be able to do joint injections, we do not provide this service in the ER for steroid injection but I did discuss with them the availability of services at [place] and [place] urgent care.  They will follow-up with orthopedics with interim return precautions.  Patient able to minimize weightbearing uses a cane and walker already at her house  ----------------------------------------- 11:35 AM on 02/09/2023 ----------------------------------------- Family had made an appointment for patient to [facility] but found out that repeat x-rays would not be covered.  They requested a copy of her CD for x-rays to take with him, I called our radiology department but unfortunately they do not have appropriate staff to provide a copy for patient to take home with her today.  Patient anticipates follow-up with either [facility], whom she has seen in the past, for [facility]  Return precautions and treatment recommendations and follow-up discussed with the patient who is agreeable with the plan.  Family taking the patient home  FINAL CLINICAL IMPRESSION(S) / ED DIAGNOSES   Final diagnoses:  Acute pain of left knee  Osteoarthritis of left knee, unspecified osteoarthritis type     Rx / DC Orders   ED Discharge Orders     None        Note:  This document was prepared using Dragon voice recognition software and may include unintentional dictation errors.   Dicky Anes, MD 02/09/23 1135

## 2023-02-10 ENCOUNTER — Ambulatory Visit: Payer: Self-pay | Admitting: *Deleted

## 2023-02-10 ENCOUNTER — Other Ambulatory Visit: Payer: Self-pay | Admitting: Internal Medicine

## 2023-02-10 NOTE — Telephone Encounter (Signed)
  Chief Complaint: left knee pain severe and swelling requesting refill of medication ultracet  Symptoms: left knee pain and swelling . Able to walk using walker or cane Frequency: 2 weeks  Pertinent Negatives: Patient denies fever  Disposition: [] ED /[] Urgent Care (no appt availability in office) / [x] Appointment(In office/virtual)/ []  [facility]/ [] Home Care/ [] Refused Recommended Disposition /[] [facility]/ []  Follow-up with PCP Additional Notes:   Went to ED yesterday for evaluation. Patient requesting refill on ultracet  due to only 3 pills left. Requesting PCP to refill until seeing her 02/14/22 appt . Please advise regarding patient medications, patient confused if she is to take ASA or not due to hx of bleed in stomach from taking Aleeve in the past.  Please advise.       Reason for Disposition  [1] Very swollen joint AND [2] no fever  Answer Assessment - Initial Assessment Questions 1. LOCATION and RADIATION: Where is the pain located?      Left knee pain swelling  2. QUALITY: What does the pain feel like?  (e.g., sharp, dull, aching, burning)     Pain behind knee  3. SEVERITY: How bad is the pain? What does it keep you from doing?   (Scale 1-10; or mild, moderate, severe)   -  MILD (1-3): doesn't interfere with normal activities    -  MODERATE (4-7): interferes with normal activities (e.g., work or school) or awakens from sleep, limping    -  SEVERE (8-10): excruciating pain, unable to do any normal activities, unable to walk     Severe pain with movement . Can walk with walker, cane  4. ONSET: When did the pain start? Does it come and go, or is it there all the time?     2 weeks  5. RECURRENT: Have you had this pain before? If Yes, ask: When, and what happened then?     Yes  6. SETTING: Has there been any recent work, exercise or other activity that involved that part of the body?      Na  7. AGGRAVATING FACTORS: What makes the knee  pain worse? (e.g., walking, climbing stairs, running)     Walking  8. ASSOCIATED SYMPTOMS: Is there any swelling or redness of the knee?     Swelling left knee  9. OTHER SYMPTOMS: Do you have any other symptoms? (e.g., chest pain, difficulty breathing, fever, calf pain)     Left knee  swelling  10. PREGNANCY: Is there any chance you are pregnant? When was your last menstrual period?       na  Protocols used: Knee Pain-A-AH

## 2023-02-10 NOTE — Telephone Encounter (Signed)
 Medication Refill -  Most Recent Primary Care Visit:  Provider: [facility]  Department: [facility]  Visit Type: LAB  Date: 10/19/2022  Medication: traMADol -acetaminophen  (ULTRACET ) 37.5-325 MG tablet   Has the patient contacted their pharmacy? yes (Agent: If yes, when and what did the pharmacy advise?)contact pcp  Is this the correct pharmacy for this prescription? yes  This is the patient's preferred pharmacy:  [id number] - [place], [place] - [address] AT [facility] OF [address] [facility] [address] [place] [address] Phone: [phone] Fax: [phone]   Has the prescription been filled recently? no  Is the patient out of the medication? No has 6 left, but is taking 3 at a time  Has the patient been seen for an appointment in the last year OR does the patient have an upcoming appointment? yes  Can we respond through MyChart? yes  Agent: Please be advised that Rx refills may take up to 3 business days. We ask that you follow-up with your pharmacy.

## 2023-02-11 ENCOUNTER — Other Ambulatory Visit: Payer: Self-pay | Admitting: Internal Medicine

## 2023-02-11 ENCOUNTER — Telehealth: Payer: Self-pay | Admitting: Internal Medicine

## 2023-02-11 MED ORDER — TRAMADOL-ACETAMINOPHEN 37.5-325 MG PO TABS: 1.0000 | ORAL_TABLET | Freq: Three times a day (TID) | ORAL | 0 refills | Status: DC | PRN

## 2023-02-11 NOTE — Telephone Encounter (Signed)
 Advised Rx was sent to pharmacy.

## 2023-02-11 NOTE — Telephone Encounter (Signed)
 This patient was taken care of earlier today. Prescription was sent to pharmacy. No further action.

## 2023-02-11 NOTE — Addendum Note (Signed)
 Addended by: Lorre Munroe on: 02/11/2023 02:32 PM   Modules accepted: Orders

## 2023-02-11 NOTE — Telephone Encounter (Signed)
 refilled

## 2023-02-11 NOTE — Telephone Encounter (Signed)
 Patients niece called stated the medication traMADol-acetaminophen needs to be filled today as patient is in a lot of pain. Advised patient of the 48-72hrs time frame. Please f/u with patients niece

## 2023-02-15 ENCOUNTER — Ambulatory Visit: Payer: Medicare PPO | Admitting: Internal Medicine

## 2023-02-17 DIAGNOSIS — M25462 Effusion, left knee: Secondary | ICD-10-CM | POA: Diagnosis not present

## 2023-02-17 DIAGNOSIS — M1712 Unilateral primary osteoarthritis, left knee: Secondary | ICD-10-CM | POA: Diagnosis not present

## 2023-02-17 DIAGNOSIS — M25562 Pain in left knee: Secondary | ICD-10-CM | POA: Diagnosis not present

## 2023-02-18 ENCOUNTER — Other Ambulatory Visit: Payer: Self-pay | Admitting: Internal Medicine

## 2023-02-21 ENCOUNTER — Telehealth: Payer: Self-pay

## 2023-02-21 NOTE — Telephone Encounter (Signed)
 Requested medication (s) are due for refill today: No  Requested medication (s) are on the active medication list: Yes  Last refill:  02/11/23  Future visit scheduled: Yes  Notes to clinic:  Unable to refill per protocol, cannot delegate.      Requested Prescriptions  Pending Prescriptions Disp Refills   traMADol -acetaminophen  (ULTRACET ) 37.5-325 MG tablet [Pharmacy Med Name: TRAMADOL /APAP 37.5MG /325MG  TABS] 30 tablet     Sig: TAKE 1 TABLET BY MOUTH EVERY 8 HOURS AS NEEDED FOR MODERATE PAIN     Not Delegated - Analgesics:  Opioid Agonist Combinations - acetaminophen  / tramadol  Failed - 02/21/2023  2:10 PM      Failed - This refill cannot be delegated      Failed - Cr in normal range and within 360 days    Creat  Date Value Ref Range Status  10/19/2022 0.97 (H) 0.60 - 0.95 mg/dL Final         Failed - Urine Drug Screen completed in last 360 days      Failed - Valid encounter within last 3 months    Recent Outpatient Visits           4 months ago Encounter for general adult medical examination with abnormal findings   [place], Angeline ORN, NP   10 months ago Pure hypercholesterolemia   [place] [facility] [place], Angeline ORN, NP   1 year ago Encounter for general adult medical examination with abnormal findings   [place], Angeline ORN, NP   1 year ago Aortic atherosclerosis [facility])   [place] [facility] [place], Angeline ORN, NP       Future Appointments             In 1 month [place], Angeline ORN, NP [place] [facility]            Passed - AST in normal range and within 360 days    AST  Date Value Ref Range Status  10/19/2022 13 10 - 35 U/L Final         Passed - ALT in normal range and within 360 days    ALT  Date Value Ref Range Status  10/19/2022 6 6 - 29 U/L Final

## 2023-02-21 NOTE — Telephone Encounter (Signed)
 Refill denied.  She is requesting this too soon.

## 2023-02-22 ENCOUNTER — Other Ambulatory Visit: Payer: Self-pay | Admitting: Internal Medicine

## 2023-02-22 NOTE — Telephone Encounter (Signed)
 Called to patient's niece- Tina(DPR) Patient was using only as needed for osteoporosis - had only been taking as needed. 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 Patient  has recently had trouble with swelling in the knee- and has seen ortho-Dr Kubinoki. Fluid was drawn and steriod injection was given. Patient was advised to take the medication around the clock for pain management. Patient has been rationing the medication - but still having pain. Patient is very strict about this medication- she keeps record of when and how much she takes. Patient thinks the provider will think she is drug seeking. Patient's niece states she is not abusing the medication.- far from it.  Patient's niece would like refill to be considered- patient is going to run out tomorrow and she is down to only 2/day trying to ration.

## 2023-02-22 NOTE — Telephone Encounter (Signed)
 Medication Refill -  Most Recent Primary Care Visit:  Provider: [facility]  Department: [facility]  Visit Type: LAB  Date: 10/19/2022  Medication: traMADol -acetaminophen  (ULTRACET ) 37.5-325 MG tablet  3rd request, first called on jan 3 about this refill  Has the patient contacted their pharmacy? yes (Agent: If yes, when and what did the pharmacy advise?)contact pcp  Is this the correct pharmacy for this prescription? yes  This is the patient's preferred pharmacy:  [facility] #[id number] - [place], [place] - [address] AT [facility] OF [address] [place] [address] [place] [address] Phone: [phone] Fax: [phone]   Has the prescription been filled recently? yes  Is the patient out of the medication? yes  Has the patient been seen for an appointment in the last year OR does the patient have an upcoming appointment? yes  Can we respond through MyChart? yes  Agent: Please be advised that Rx refills may take up to 3 business days. We ask that you follow-up with your pharmacy.

## 2023-02-24 ENCOUNTER — Other Ambulatory Visit: Payer: Self-pay | Admitting: Internal Medicine

## 2023-02-24 NOTE — Telephone Encounter (Signed)
See previous message from 02/24/23

## 2023-02-24 NOTE — Telephone Encounter (Signed)
Spoke with Brandy Orr (niece of patient) and advised that we could not send in another refill on the Tramadol since it was just filled on 02/11/2023. Advised niece that patient could come back in for a visit with Brandy Orr to discuss further options. Niece states patient has been taking the Tramadol every 8 hours as prescribed by Brandy Orr. Patient saw ortho last week to have fluid drained and discuss her arthritis in her knees. Ortho doctor told patient to continue to take the Tramadol every 8 hours as prescribed by [facility]. Niece declined an appointment with Brandy Orr. She continued by saying that they gave Brandy Orr the benefit of the doubt in the beginning with her title being an "NP." She said they no longer wanted to see Brandy Orr, and that Brandy Orr did not have Brandy Orr's best interest in mind and did not seem to care about her well being. She states that she always has to do the "leg work" with prescriptions and that no one ever calls her back from the office. She states that she had put in a request for patient to be switched to Dr. Kirtland Orr, whom her mother see's, and states he is great. She asked that I follow up on that request. I told her that I would reach out to someone who could help her with this matter and have them reach back out to her. Brandy Orr wanted to know what [name] was supposed to do in the meantime. I advised her that Brandy Orr recommended her taking Tylenol arthritis 650 mg TID. Brandy Orr states [name] is allergic to Tylenol and Advil and should not be taking this. I advised Brandy Orr per Brandy Orr, that Acetaminophen was in the Tramadol that patient has been taking and tolerating well. She didn't say anything further about this matter. Could you follow up about patient being switched to Dr. Kirtland Orr? Thanks!

## 2023-02-24 NOTE — Telephone Encounter (Signed)
She only gets 30 pills a month. If she has had an increase in her pain, she needs to schedule an appt with me. Otherwise she can take Tylenol arthritis 650 mg TID

## 2023-02-24 NOTE — Telephone Encounter (Signed)
Copied from CRM [id number]. Topic: General - Other >> Feb 24, 2023 10:08 AM Marlow Baars wrote: Reason for CRM: Inetta Fermo the niece of the patient called in stating she is taking her meds as directed 3 pills every 24 hours and that is why she has run out. She states the patient is in pain all the time and wakes up in the middle of the night to take a pill. I spoke with Rachell and she said a nurse was going to call the pharmacy and get back with [place] afterwards. Inetta Fermo did state if she cannot get the Tramdol what else could see take cause she has an ulcer and cannot take Advil or Aleve. Please assist further

## 2023-04-14 ENCOUNTER — Ambulatory Visit: Payer: Self-pay | Admitting: Internal Medicine

## 2023-04-14 NOTE — Progress Notes (Deleted)
 Subjective:    Patient ID: Brandy Orr, female    DOB: Nov 19, 1932, 89 y.o.   MRN: [id number]  HPI  Patient presents to clinic today for follow-up of chronic conditions.  OA: Generalized but worse in her neck and back.  She takes ultracet as prescribed with good relief of symptoms.  She does not follow with orthopedics.  GERD: She is not sure what triggers this. She has been having some difficulty swallowing. She takes pantoprazole as prescribed.  Upper GI from 07/2014 reviewed.  CKD 3: Her last creatinine was 0.97, GFR 56, 10/2022.  She is not currently on an ACEI/ARB.  She does not follow with nephrology.  HLD with aortic atherosclerosis: Her last LDL was 135, triglycerides 114, 10/2022.  She is not taking any cholesterol-lowering medication at this time.  She is not taking aspirin daily.  She tries to consume a low-fat diet.  Review of Systems     Past Medical History:  Diagnosis Date   Arthritis    Cancer (HCC)    skin ca   Duodenal ulcer    Gastric ulcer     Current Outpatient Medications  Medication Sig Dispense Refill   acetaminophen (TYLENOL) 650 MG CR tablet Take 650-1,300 mg by mouth every 8 (eight) hours as needed for pain.      aspirin EC 81 MG tablet Take 1 tablet (81 mg total) by mouth daily. Swallow whole. (Patient not taking: Reported on 10/14/2022) 30 tablet 12   Glucosamine-Chondroitin 500-400 MG CAPS Take by mouth.     polyethylene glycol (MIRALAX / GLYCOLAX) 17 g packet Take 17 g by mouth daily.     traMADol-acetaminophen (ULTRACET) 37.5-325 MG tablet Take 1 tablet by mouth every 8 (eight) hours as needed for moderate pain (pain score 4-6). 30 tablet 0   No current facility-administered medications for this visit.    Allergies  Allergen Reactions   Oxycodone Nausea And Vomiting   Hydrocodone Nausea And Vomiting    Family History  Problem Relation Age of Onset   Leukemia Mother    Diabetes Mother    Heart disease Father    Diabetes Father      Social History   Socioeconomic History   Marital status: Widowed    Spouse name: Not on file   Number of children: 1   Years of education: Not on file   Highest education level: Not on file  Occupational History   Occupation: retired  Tobacco Use   Smoking status: Never   Smokeless tobacco: Never  Vaping Use   Vaping status: Never Used  Substance and Sexual Activity   Alcohol use: No   Drug use: No   Sexual activity: Not on file  Other Topics Concern   Not on file  Social History Narrative   Not on file   Social Drivers of Health   Financial Resource Strain: Low Risk  (07/16/2022)   Overall Financial Resource Strain (CARDIA)    Difficulty of Paying Living Expenses: Not hard at all  Food Insecurity: No Food Insecurity (07/16/2022)   Hunger Vital Sign    Worried About Running Out of Food in the Last Year: Never true    Ran Out of Food in the Last Year: Never true  Transportation Needs: No Transportation Needs (07/16/2022)   PRAPARE - Administrator, Civil Service (Medical): No    Lack of Transportation (Non-Medical): No  Physical Activity: Inactive (07/16/2022)   Exercise Vital Sign    Days  of Exercise per Week: 0 days    Minutes of Exercise per Session: 0 min  Stress: No Stress Concern Present (07/16/2022)   [employer] of Occupational Health - Occupational Stress Questionnaire    Feeling of Stress : Only a little  Social Connections: Socially Isolated (07/16/2022)   Social Connection and Isolation Panel [NHANES]    Frequency of Communication with Friends and Family: Once a week    Frequency of Social Gatherings with Friends and Family: Never    Attends Religious Services: Never    Database administrator or Organizations: No    Attends Banker Meetings: Never    Marital Status: Widowed  Intimate Partner Violence: Not At Risk (07/16/2022)   Humiliation, Afraid, Rape, and Kick questionnaire    Fear of Current or Ex-Partner: No    Emotionally  Abused: No    Physically Abused: No    Sexually Abused: No     Constitutional: Denies fever, malaise, fatigue, headache or abrupt weight changes.  HEENT: Denies eye pain, eye redness, ear pain, ringing in the ears, wax buildup, runny nose, nasal congestion, bloody nose, or sore throat. Respiratory: Denies difficulty breathing, shortness of breath, cough or sputum production.   Cardiovascular: Denies chest pain, chest tightness, palpitations or swelling in the hands or feet.  Gastrointestinal: Pt reports difficulty swallowing, abdominal bloating, intermittent diarrhea. Denies abdominal pain, bloating, constipation, diarrhea or blood in the stool.  GU: Pt reports urge incontinence. Denies frequency, pain with urination, burning sensation, blood in urine, odor or discharge. Musculoskeletal: Patient reports joint pain.  Denies decrease in range of motion, difficulty with gait, muscle pain or joint swelling.  Skin: Denies redness, rashes, lesions or ulcercations.  Neurological: Denies dizziness, difficulty with memory, difficulty with speech or problems with balance and coordination.  Psych: Denies anxiety, depression, SI/HI.  No other specific complaints in a complete review of systems (except as listed in HPI above).  Objective:   Physical Exam  There were no vitals taken for this visit.  Wt Readings from Last 3 Encounters:  02/09/23 180 lb (81.6 kg)  10/14/22 155 lb (70.3 kg)  07/16/22 153 lb (69.4 kg)    General: Appears her stated age, well developed, well nourished in NAD. Skin: Warm, dry and intact.  HEENT: Head: normal shape and size; Eyes: sclera white, no icterus, conjunctiva pink, PERRLA and EOMs intact;  Cardiovascular: Normal rate and rhythm. S1,S2 noted.  No murmur, rubs or gallops noted. No JVD or BLE edema. No carotid bruits noted. Pulmonary/Chest: Normal effort and positive vesicular breath sounds. No respiratory distress. No wheezes, rales or ronchi noted.  Abdomen:  Soft and nontender. Normal bowel sounds. No distention or masses noted. Liver, spleen and kidneys non palpable. Musculoskeletal: Kyphotic. Joint enlargement noted in hands. Gait slow and steady with use of cane. Neurological: Alert and oriented. Coordination normal.  Psychiatric: Mood and affect normal. Behavior is normal. Judgment and thought content normal.     BMET    Component Value Date/Time   NA 141 10/19/2022 1552   NA 139 03/11/2014 1411   K 4.1 10/19/2022 1552   K 3.6 03/11/2014 1411   CL 107 10/19/2022 1552   CL 103 03/11/2014 1411   CO2 26 10/19/2022 1552   CO2 29 03/11/2014 1411   GLUCOSE 95 10/19/2022 1552   GLUCOSE 79 03/11/2014 1411   BUN 23 10/19/2022 1552   BUN 15 03/11/2014 1411   CREATININE 0.97 (H) 10/19/2022 1552   CALCIUM 9.6 10/19/2022 1552  CALCIUM 9.0 03/11/2014 1411   GFRNONAA >60 01/04/2017 0839   GFRNONAA >60 03/11/2014 1411   GFRAA >60 01/04/2017 0839   GFRAA >60 03/11/2014 1411    Lipid Panel     Component Value Date/Time   CHOL 228 (H) 10/19/2022 1552   TRIG 114 10/19/2022 1552   HDL 71 10/19/2022 1552   CHOLHDL 3.2 10/19/2022 1552   LDLCALC 135 (H) 10/19/2022 1552    CBC    Component Value Date/Time   WBC 6.9 10/19/2022 1552   RBC 3.82 10/19/2022 1552   HGB 12.6 10/19/2022 1552   HGB 12.7 03/11/2014 1411   HCT 37.9 10/19/2022 1552   HCT 37.3 03/11/2014 1411   PLT 309 10/19/2022 1552   PLT 341 03/11/2014 1411   MCV 99.2 10/19/2022 1552   MCV 97 03/11/2014 1411   MCH 33.0 10/19/2022 1552   MCHC 33.2 10/19/2022 1552   RDW 12.3 10/19/2022 1552   RDW 13.4 03/11/2014 1411    Hgb A1C Lab Results  Component Value Date   HGBA1C 5.5 10/19/2022            Assessment & Plan:     RTC in 6 months for your annual exam Nicki Reaper, NP

## 2023-07-22 ENCOUNTER — Ambulatory Visit: Payer: Medicare PPO

## 2023-11-16 ENCOUNTER — Other Ambulatory Visit: Payer: Self-pay | Admitting: Internal Medicine

## 2023-11-16 DIAGNOSIS — R413 Other amnesia: Secondary | ICD-10-CM

## 2023-11-23 ENCOUNTER — Ambulatory Visit
Admission: RE | Admit: 2023-11-23 | Discharge: 2023-11-23 | Disposition: A | Discharge status: Home / Self Care | Source: Ambulatory Visit | Attending: Internal Medicine | Admitting: Internal Medicine

## 2023-11-23 DIAGNOSIS — R413 Other amnesia: Secondary | ICD-10-CM | POA: Diagnosis present

## 2024-01-16 ENCOUNTER — Emergency Department

## 2024-01-16 ENCOUNTER — Other Ambulatory Visit: Payer: Self-pay

## 2024-01-16 ENCOUNTER — Emergency Department: Admission: EM | Admit: 2024-01-16 | Discharge: 2024-01-16 | Disposition: A | Discharge status: Home / Self Care

## 2024-01-16 DIAGNOSIS — M5431 Sciatica, right side: Secondary | ICD-10-CM

## 2024-01-16 DIAGNOSIS — M25552 Pain in left hip: Secondary | ICD-10-CM

## 2024-01-16 MED ORDER — KETOROLAC TROMETHAMINE 15 MG/ML IJ SOLN
15.0000 mg | Freq: Once | INTRAMUSCULAR | Status: AC
  Administered 2024-01-16: 15 mg via INTRAMUSCULAR
  Filled 2024-01-16: qty 1

## 2024-01-16 MED ORDER — LIDOCAINE 5 % EX PTCH: 1.0000 | MEDICATED_PATCH | CUTANEOUS | 0 refills | Status: AC

## 2024-01-16 MED ORDER — LIDOCAINE 5 % EX PTCH
1.0000 | MEDICATED_PATCH | CUTANEOUS | Status: DC
  Administered 2024-01-16: 1 via TRANSDERMAL
  Filled 2024-01-16: qty 1

## 2024-01-16 NOTE — ED Notes (Signed)
 See triage note  Presents with pain to right hip  Denies any recent falls  States she has had pain in the past s/p hip replacement  But this pain radiates from her ankle to her hip

## 2024-01-16 NOTE — ED Triage Notes (Signed)
 Pt comes in via pov with complaints of right hip pain. Pt states that she had a hip a hip replacement in 2015, pt states a couple weeks ago she started having pain in the her right hip. This morning she felt a shooting pain from her ankle to her hip. Pt took some tylenol  this morning with slight relief. Pt believes her hip is out of place a bit. Pt denies any falls or trauma, and complains of pain 10/10 when she feels it.

## 2024-01-16 NOTE — ED Provider Notes (Signed)
 [facility] Provider Note    Event Date/Time   First MD Initiated Contact with Patient 01/16/24 [phone]     (approximate)   History   Hip Pain  Pt comes in via pov with complaints of right hip pain. Pt states that she had a hip a hip replacement in 2015, pt states a couple weeks ago she started having pain in the her right hip. This morning she felt a shooting pain from her ankle to her hip. Pt took some tylenol  this morning with slight relief. Pt believes her hip is out of place a bit. Pt denies any falls or trauma, and complains of pain 10/10 when she feels it.   HPI Brandy Orr is a 89 y.o. female PMH osteoarthritis, CKD, hyperlipidemia, prior right hip replacement (2015) presents for evaluation of right hip pain - Patient states she intermittently has right hip pain that radiates down her leg.  Today was acutely worse.  No preceding trauma.  No weakness or numbness. - Accompanied by family who provides collateral.  States they are in the process of getting her in assisted living facility, chronically has difficulty with ambulation and standing due to chronic pain. - No recent falls - Patient does states she had a hip replacement in her right hip and 2015 and wonders if the hardware may be malfunctioning - Pain radiates from right lower flank/posterior hip down to about her ankle.  Worse with standing or movement. - No urinary symptoms       Physical Exam   Triage Vital Signs: ED Triage Vitals  Encounter Vitals Group     BP 01/16/24 0814 122/69     Girls Systolic BP Percentile --      Girls Diastolic BP Percentile --      Boys Systolic BP Percentile --      Boys Diastolic BP Percentile --      Pulse Rate 01/16/24 0814 93     Resp 01/16/24 0814 17     Temp 01/16/24 0814 98 F (36.7 C)     Temp src --      SpO2 01/16/24 0814 96 %     Weight 01/16/24 0817 179 lb 14.3 oz (81.6 kg)     Height 01/16/24 0817 5' 6 (1.676 m)     Head  Circumference --      Peak Flow --      Pain Score 01/16/24 0814 0     Pain Loc --      Pain Education --      Exclude from Growth Chart --     Most recent vital signs: Vitals:   01/16/24 0814  BP: 122/69  Pulse: 93  Resp: 17  Temp: 98 F (36.7 C)  SpO2: 96%     General: Awake, no distress.  CV:  Good peripheral perfusion. RRR, RP 2+ Resp:  Normal effort. CTAB Abd:  No distention. Nontender to deep palpation throughout Back:   Tender to palpation right lower flank/posterior hip.  No midline tenderness.   Other:  Good perfusion bilateral lower extremities. RLE:  Full range of motion of all joints.  No tenderness to palpation throughout.  No deformities.   ED Results / Procedures / Treatments   Labs (all labs ordered are listed, but only abnormal results are displayed) Labs Reviewed - No data to display   EKG N/a   RADIOLOGY Radiology interpreted by myself and radiology report reviewed.  No acute pathology identified.  Age indeterminante  compression fractures of spine, suspect chronic on my eval.    PROCEDURES:  Critical Care performed: No  Procedures   MEDICATIONS ORDERED IN ED: Medications  lidocaine  (LIDODERM ) 5 % 1 patch (1 patch Transdermal Patch Applied 01/16/24 0850)  ketorolac  (TORADOL ) 15 MG/ML injection 15 mg (15 mg Intramuscular Given 01/16/24 0849)     IMPRESSION / MDM / ASSESSMENT AND PLAN / ED COURSE  I reviewed the triage vital signs and the nursing notes.                              DDX/MDM/AP: Differential diagnosis includes, but is not limited to, likely sciatica, considered but doubt nontraumatic fracture or hardware malfunction.  Doubt osseous lesion.  Do not suspect urinary etiology.  Plan: - Pain medications - XR right hip, lumbar spine - No negation for labs at this time  Patient's presentation is most consistent with acute complicated illness / injury requiring diagnostic workup.   ED course below.   Clinical Course as  of 01/16/24 1100  Mon Jan 16, 2024  9050 X-ray right hip and L spine with no obvious acute pathology on my interpretation, formal radiology read pending [MM]  1036 XR R hip: IMPRESSION: 1. Right total hip arthroplasty in place. 2. 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   [MM]  1036 XR L spine: IMPRESSION: 1. Age indeterminate compression deformities at T12 and L3. 2. Diffuse endplate degeneration with trace anterolisthesis of L4 on 5.   [MM]  1057 Patient reevaluated, all pain has resolved.  Discussed x-ray findings, do not suspect acute spinal fractures with no point tenderness to midline spine.  No evidence of acute pathology today.  X-ray of hip did note Paget disease though this was also noted in the prior operative report from Dr. Edie in 2016.   Presentation overall consistent with sciatica.  Plan to use Tylenol  ATC which has already been recommended to patient in the past yet she had only been using as needed.  Also Rx Lidoderm  patches.  Plan for PMD and/for orthopedic follow-up.  ED return precautions in place.  Patient family agree with plan. [MM]    Clinical Course User Index [MM] Clarine Ozell LABOR, MD     FINAL CLINICAL IMPRESSION(S) / ED DIAGNOSES   Final diagnoses:  Sciatica of right side  Left hip pain     Rx / DC Orders   ED Discharge Orders          Ordered    lidocaine  (LIDODERM ) 5 %  Every 24 hours        01/16/24 0855             Note:  This document was prepared using Dragon voice recognition software and may include unintentional dictation errors.   Clarine Ozell LABOR, MD 01/16/24 1100

## 2024-01-16 NOTE — Discharge Instructions (Addendum)
 Your evaluation in the emergency department was overall reassuring.  I suspect you have a pinched nerve in your right lower back that causes pain to radiate down your right leg.  I anticipate this will improve over the next several days.  I prescribed you Lidoderm  patches to use, and you can use Tylenol  for any ongoing discomfort.  Please do follow-up with your primary care provider and/your orthopedic provider for reevaluation, and return to the emergency department with any new or worsening symptoms.

## 2024-01-28 ENCOUNTER — Emergency Department
Admission: EM | Admit: 2024-01-28 | Discharge: 2024-01-28 | Disposition: A | Discharge status: Home / Self Care | Attending: Emergency Medicine | Admitting: Emergency Medicine

## 2024-01-28 ENCOUNTER — Other Ambulatory Visit: Payer: Self-pay

## 2024-01-28 DIAGNOSIS — N183 Chronic kidney disease, stage 3 unspecified: Secondary | ICD-10-CM | POA: Diagnosis not present

## 2024-01-28 DIAGNOSIS — M5441 Lumbago with sciatica, right side: Secondary | ICD-10-CM | POA: Insufficient documentation

## 2024-01-28 DIAGNOSIS — M549 Dorsalgia, unspecified: Secondary | ICD-10-CM | POA: Diagnosis present

## 2024-01-28 DIAGNOSIS — G8929 Other chronic pain: Secondary | ICD-10-CM | POA: Diagnosis not present

## 2024-01-28 MED ORDER — ACETAMINOPHEN 325 MG PO TABS
650.0000 mg | ORAL_TABLET | Freq: Once | ORAL | Status: AC
  Administered 2024-01-28: 650 mg via ORAL
  Filled 2024-01-28: qty 2

## 2024-01-28 MED ORDER — LIDOCAINE 5 % EX PTCH
1.0000 | MEDICATED_PATCH | CUTANEOUS | Status: DC
  Administered 2024-01-28: 1 via TRANSDERMAL
  Filled 2024-01-28: qty 1

## 2024-01-28 MED ORDER — LIDOCAINE 5 % EX PTCH: 1.0000 | MEDICATED_PATCH | Freq: Two times a day (BID) | CUTANEOUS | 0 refills | Status: AC

## 2024-01-28 NOTE — ED Triage Notes (Signed)
 Pt presents for sciatic nerve pain that woke her from sleep. Radiating down right leg. Recently diagnosed with same. Took 2 Tylenol  prior to arrival and now endorsing relief of pain.   Past Medical History:  Diagnosis Date   Arthritis    Cancer (HCC)    skin ca   Duodenal ulcer    Gastric ulcer

## 2024-01-28 NOTE — Discharge Instructions (Addendum)
 You may take Tylenol  as scheduled as we discussed, though do not exceed 4 g in 24 hours.  You may also use the Lidoderm  patches which have been refilled for you.  Please follow-up with your orthopedic surgeon regarding your hip, and you may also follow-up with neurosurgery regarding your back. Please return to the emergency department for any new, worsening, or changing symptoms or other concerns including weakness in your legs, urinary or stool incontinence or retention, numbness or tingling in your extremities/buttocks/groin, fevers, or any other concerns or change in symptoms. It was a pleasure caring for you today.

## 2024-01-28 NOTE — ED Provider Notes (Signed)
 "  [facility] Provider Note    Event Date/Time   First MD Initiated Contact with Patient 01/28/24 [phone]     (approximate)   History   Back Pain   HPI  Brandy Orr is a 89 y.o. female with a past medical history of hypercholesterolemia, CKD, generalized osteoarthritis, GERD who presents today for evaluation of right-sided back pain with radiation down her right leg to her ankle.  Patient reports this feels exactly the same as when she was seen previously.  She reports that she took Tylenol  at home and her pain has resolved.  She denies any new injuries.  She reports that she ran out of the Lidoderm  patches yesterday and also did not take her dose of Tylenol  in the middle of the night which is what she believes exacerbated her pain.  She has not had any urinary/fecal incontinence or retention different from her baseline and has been present over the past several years.  No fevers or chills.  No abdominal pain.  She is able to ambulate with her usual assist devices.  No recent falls or trauma.  She is not anticoagulated.  Patient reports that she is unsure how often she can take the Tylenol  and is afraid of taking too much.  She reports that she needs a refill of the Lidoderm  patches.  She reports that she also thought that her hip had shifted, but reports that she had an x-ray and this was normal.  No new falls or complaints since that time.  She denies abdominal pain or dysuria.  No fevers or chills.  No nausea or vomiting.  No numbness or tingling.  Patient Active Problem List   Diagnosis Date Noted   Pure hypercholesterolemia 04/08/2022   CKD (chronic kidney disease) stage 3, GFR 30-59 ml/min (HCC) 04/08/2022   Generalized osteoarthritis 03/26/2021   GERD (gastroesophageal reflux disease) 03/26/2021   Aortic atherosclerosis 03/26/2021          Physical Exam   Triage Vital Signs: ED Triage Vitals [01/28/24 0552]  Encounter Vitals Group     BP 113/62      Girls Systolic BP Percentile      Girls Diastolic BP Percentile      Boys Systolic BP Percentile      Boys Diastolic BP Percentile      Pulse Rate 75     Resp 18     Temp 97.6 F (36.4 C)     Temp Source Oral     SpO2 94 %     Weight 140 lb (63.5 kg)     Height 5' 9 (1.753 m)     Head Circumference      Peak Flow      Pain Score      Pain Loc      Pain Education      Exclude from Growth Chart     Most recent vital signs: Vitals:   01/28/24 0552  BP: 113/62  Pulse: 75  Resp: 18  Temp: 97.6 F (36.4 C)  SpO2: 94%    Physical Exam Vitals and nursing note reviewed.  Constitutional:      General: Awake and alert. No acute distress.    Appearance: Normal appearance. The patient is normal weight.  HENT:     Head: Normocephalic and atraumatic.     Mouth: Mucous membranes are moist.  Eyes:     General: PERRL. Normal EOMs        Right eye:  No discharge.        Left eye: No discharge.     Conjunctiva/sclera: Conjunctivae normal.  Cardiovascular:     Rate and Rhythm: Normal rate and regular rhythm.     Pulses: Normal pulses.  Pulmonary:     Effort: Pulmonary effort is normal. No respiratory distress.     Breath sounds: Normal breath sounds.  Abdominal:     Abdomen is soft. There is no abdominal tenderness. No rebound or guarding. No distention. Musculoskeletal:        General: No swelling. Normal range of motion.     Cervical back: Normal range of motion and neck supple.  Back: No midline tenderness. Strength and sensation 5/5 to bilateral lower extremities. Normal great toe extension against resistance. Normal sensation throughout feet. Normal patellar reflexes. Negative SLR and opposite SLR bilaterally.  Able to lift both legs up off of the stretcher.  Pelvis is stable.  Negative logroll bilaterally.   Skin:    General: Skin is warm and dry.     Capillary Refill: Capillary refill takes less than 2 seconds.     Findings: No rash.  Neurological:     Mental Status:  The patient is awake and alert.      ED Results / Procedures / Treatments   Labs (all labs ordered are listed, but only abnormal results are displayed) Labs Reviewed - No data to display   EKG     RADIOLOGY     PROCEDURES:  Critical Care performed:   Procedures   MEDICATIONS ORDERED IN ED: Medications  lidocaine  (LIDODERM ) 5 % 1 patch (1 patch Transdermal Patch Applied 01/28/24 0816)  acetaminophen  (TYLENOL ) tablet 650 mg (650 mg Oral Given 01/28/24 0816)     IMPRESSION / MDM / ASSESSMENT AND PLAN / ED COURSE  I reviewed the triage vital signs and the nursing notes.   Differential diagnosis includes, but is not limited to, lumbar radiculopathy, sciatica, doubt occult fracture or hardware complication.  I reviewed the patient's chart.  Patient was seen in the emergency department on 01/16/2024 with the same complaint and had x-rays at that time which revealed degenerative changes in her spine with trace anterolisthesis of L4 on L5.  Her x-ray obtained reveals right total hip arthroplasty with Paget's disease of the right inferior pubic ramus and ischium which was noted in the operative report from orthopedics in 2016.  Patient is awake and alert, hemodynamically stable and afebrile.  She has no complaints at rest, she has normal active and passive range of motion of bilateral hips, knees, ankles.  Her abdomen is soft and nontender throughout.  She is negative logroll bilaterally.  She is able to ambulate with her assist devices.  She has normal strength and sensation of bilateral lower extremities, no urinary/fecal incontinence or retention or saddle anesthesia to suggest cord compression or cauda equina.  We discussed the option of advanced imaging, laboratory testing, however patient declines any testing today.  She is wondering how often she can take her Tylenol , and also requesting more Lidoderm  patches given that she has run out of them.  We discussed proper dosing of  Tylenol , and her Lidoderm  patches were refilled.  She has no other complaints today.  I recommend that she follow-up with her orthopedic surgeon for her hip, and she was also given the information for neurosurgery for her back.  We discussed symptomatic management and return precautions in the meantime.  Patient understands and agrees with plan.  She is comfortable  with this plan.  She was discharged in stable condition with her family member.  Patient's presentation is most consistent with exacerbation of chronic illness.     FINAL CLINICAL IMPRESSION(S) / ED DIAGNOSES   Final diagnoses:  Chronic right-sided low back pain with right-sided sciatica     Rx / DC Orders   ED Discharge Orders          Ordered    lidocaine  (LIDODERM ) 5 %  Every 12 hours        01/28/24 0759             Note:  This document was prepared using Dragon voice recognition software and may include unintentional dictation errors.   Emmie Frakes E, PA-C 01/28/24 1315    Bradler, Evan K, MD 01/28/24 [phone]  "

## 2024-01-28 NOTE — ED Notes (Signed)
 See triage note  Presents lower back pain  States pain is moving down right leg  Denies any injury   Hx ox sciatica

## 2024-02-10 NOTE — Progress Notes (Signed)
 History of Present Illness: Brandy Orr is an 89 y.o. female who presents for evaluation and treatment of her right-sided lower back, right buttock, and right hip pain with radiation to the right lower extremity.  The patient is now 10 years status post a right total hip arthroplasty.  The symptoms have been present for about 2 months and developed without any apparent cause or injury.  The patient presented to the emergency room on 01/16/2024, and again on 01/28/2024.  X-rays from [facility] emergency room showed that the right total hip arthroplasty implants appeared to be in good position without evidence of loosening, but that the lumbar films demonstrated significant degenerative changes with evidence of old compression fractures and significant osteopenia.  There also was evidence of Paget's disease involving the right hemipelvis region, but no evidence for any acute bony processes.  The pain is severe, intermittent, and 10 on a scale of 1-10 when at its worst.  She describes the pain as aching, stabbing, and throbbing.  The symptoms are aggravated by standing, walking, stooping or bending, lifting, carrying heavy loads, daily activities, and at night.  The symptoms improve with sitting, lying down, acetaminophen , and rest.  She reports weakness in both lower extremities and has difficulty with any standing or ambulation.  In fact, she recently was admitted to [facility] assisted living facility due to her no longer being able to care for herself at home.  However, she denies any numbness or paresthesias down her legs to her feet.  She reports  no bladder, bowel or sexual dysfunction.  She has not yet tried any formal physical therapy or steroid injections as apparently they are waiting for orthopedic clearance in regards to her right total hip replacement.   Current Outpatient Medications  Medication Sig Dispense Refill   acetaminophen  (TYLENOL ) 500 MG tablet Take 500 mg by mouth every 8 (eight)  hours as needed for Pain 2 tabs as needed       azelastine (OPTIVAR) 0.05 % ophthalmic solution PLACE 1 DROP IN BOTH EYES TWICE DAILY FOR 10 DAYS AS NEEDED FOR ALLERGY     cholecalciferol 1000 unit tablet Take by mouth     No current facility-administered medications for this visit.   Allergies  Allergen Reactions   Hydrocodone Nausea And Vomiting   Oxycodone Nausea And Vomiting   Past Medical History:  Diagnosis Date   Ankle fracture 2002   bilateral   Aortic atherosclerosis 03/26/2021   Basal cell carcinoma    nose   CKD stage 3a, GFR 45-59 ml/min (CMS-HCC) 08/09/2023   Lumbago    Osteopenia    on bone density   Other and unspecified hyperlipidemia    Ovarian cyst    Past Surgical History:  Procedure Laterality Date   HYSTERECTOMY  1974   and appendix removed   EGD  03/23/2014   (Inpt) No repeat per RTE   EGD  07/12/2014   No repeat per RTE   ARTHROPLASTY HIP TOTAL     CATARACT EXTRACTION     OOPHORECTOMY     TONSILLECTOMY      Family History  Problem Relation Name Age of Onset   Leukemia Mother     Prostate cancer Father     Heart disease Father     Myocardial Infarction (Heart attack) Father     Diabetes Brother     Stroke Maternal Grandfather      Social History   Socioeconomic History   Marital status: Widowed   Number  of children: 0   Years of education: 12  Tobacco Use   Smoking status: Never   Smokeless tobacco: Never  Vaping Use   Vaping status: Never Used  Substance and Sexual Activity   Alcohol  use: No   Drug use: No   Sexual activity: Defer  Social History Narrative   Widowed - first husband  had brain tumor and died.    Second husband died 42   Had a son die of HIV in 25. He was followed at [facility] by Dr. 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, adult of radiation oncology residency program   Social Drivers of Health   Financial Resource Strain: Low Risk  (12/28/2023)   Overall Financial Resource Strain  (CARDIA)    Difficulty of Paying Living Expenses: Not hard at all  Food Insecurity: No Food Insecurity (12/28/2023)   Hunger Vital Sign    Worried About Running Out of Food in the Last Year: Never true    Ran Out of Food in the Last Year: Never true  Transportation Needs: No Transportation Needs (12/28/2023)   PRAPARE - Administrator, Civil Service (Medical): No    Lack of Transportation (Non-Medical): No    Review of Systems:  A comprehensive 14 point ROS was performed, reviewed, and the pertinent orthopaedic findings are documented in the HPI.  Physical Exam: Vitals:   02/10/24 1417  BP: 120/80  PainSc:   8  PainLoc: Hip   General/Constitutional: The patient appears to be well-nourished, well-developed, and in no acute distress. Neuro/Psych: Normal mood and affect, oriented to person, place and time. Eyes: Non-icteric.  Pupils are equal, round, and reactive to light, and exhibit synchronous movement. ENT: Unremarkable. Lymphatic: No palpable adenopathy. Respiratory: No wheezes and Non-labored breathing Cardiovascular: No edema, swelling or tenderness, except as noted in detailed exam. Integumentary: No impressive skin lesions present, except as noted in detailed exam. Musculoskeletal: Unremarkable, except as noted in detailed exam.  Lumbar exam: Skin inspection of the lower back is unremarkable.  In stance, her spine exhibits a moderate kyphoscoliotic deformity of her thoracolumbar spine.  Her pelvis is slightly uneven, presumably due to her inability to fully extend her left knee due to significant degenerative joint disease of the left knee.  She can arise from a seated position with significant difficulty.  Her gait is not able to be assessed due to her poor balance and significant weakness.  She has no tenderness along the thoracic or lumbar spine, nor across the sacrum.  She has no pain over either sciatic notch region, either trochanteric region, or either SI  joint.  She is unable to heel raise or toe raise due to her significant weakness in poor balance.    Hip exam: She has no pain with bilateral hip range of motion.  Her right hip moves freely and without catching.  She is able to actively flex her hip and actively perform a straight leg raise bilaterally.  She appears to be grossly neurovascularly intact to both lower extremities in that sensation is intact to light touch in all distributions and she is able to dorsiflex and plantarflex her toes and ankles, although ankle dorsiflexion on the right side is somewhat weak.  She has negative sitting straight leg raises bilaterally.  X-rays/MRI/Lab data:   X-rays of the pelvis and right hip are obtained.  These films demonstrate excellent position of the femoral and acetabular components which are without evidence of loosening.  The femoral head  is concentrically located within the acetabulum.  There is evidence of a diffuse lytic process involving the anterior and lateral portions of the right hemipelvis, consistent with her history of Paget's disease.  Recent AP, lateral, and oblique x-rays of the lumbar spine are available for review and have been reviewed by myself.  These films demonstrate advanced multilevel degenerative disc disease as manifested by disc space narrowing and anterior osteophyte formation.  A grade 1 degenerative anterolisthesis of L4 on L5.  There also appears to be a small anterolisthesis of L5 on S1.  Old compression fractures of T12-L3 also are noted without any evidence of new acute pathology.    Assessment: Encounter Diagnoses  Name Primary?   Acute pain of left hip Yes   Status post total replacement of right hip    Spondylarthrosis     Plan: The treatment options were discussed with the patient and her son.  In addition, patient educational materials were provided regarding the diagnosis and treatment options.  The patient is quite frustrated by her symptoms and  functional limitations.  However, she is reassured that her hip prosthesis appears to be in good position and without evidence of loosening.  I do feel that the majority of her symptoms are most likely coming from her back rather than from her hip joint.  However, I cannot rule out some contribution of the Pagetoid bone around the right hemipelvis that might be contributing to her symptoms.  Therefore I have recommended that she begin formal therapy at [facility] working on standing, balance, and lower extremity strengthening exercises.  She may gradually resume her normal daily activities as symptoms permit, but is to avoid offending activities.  She may continue her present medications as needed for discomfort.  All of the patient's questions and concerns were answered.  She can call any time with further concerns.  I have also explained to her that she might benefit from a lumbar epidural injection through [facility] physiatry department, but she would like to think about this option at present.  She will follow up with me on an as necessary basis.  This office visit took 30 minutes, of which >50% involved patient counseling/education.

## 2024-02-24 ENCOUNTER — Emergency Department

## 2024-02-24 ENCOUNTER — Other Ambulatory Visit: Payer: Self-pay

## 2024-02-24 ENCOUNTER — Inpatient Hospital Stay
Admission: EM | Admit: 2024-02-24 | Discharge: 2024-02-26 | DRG: 308 | Disposition: A | Discharge status: Hospice | Attending: Internal Medicine | Admitting: Internal Medicine

## 2024-02-24 DIAGNOSIS — R296 Repeated falls: Secondary | ICD-10-CM | POA: Diagnosis present

## 2024-02-24 DIAGNOSIS — Z7901 Long term (current) use of anticoagulants: Secondary | ICD-10-CM

## 2024-02-24 DIAGNOSIS — S0083XA Contusion of other part of head, initial encounter: Secondary | ICD-10-CM | POA: Diagnosis present

## 2024-02-24 DIAGNOSIS — Z66 Do not resuscitate: Secondary | ICD-10-CM | POA: Diagnosis present

## 2024-02-24 DIAGNOSIS — Z90710 Acquired absence of both cervix and uterus: Secondary | ICD-10-CM

## 2024-02-24 DIAGNOSIS — Z885 Allergy status to narcotic agent status: Secondary | ICD-10-CM

## 2024-02-24 DIAGNOSIS — I82451 Acute embolism and thrombosis of right peroneal vein: Secondary | ICD-10-CM

## 2024-02-24 DIAGNOSIS — W19XXXA Unspecified fall, initial encounter: Secondary | ICD-10-CM | POA: Insufficient documentation

## 2024-02-24 DIAGNOSIS — Z993 Dependence on wheelchair: Secondary | ICD-10-CM

## 2024-02-24 DIAGNOSIS — Z515 Encounter for palliative care: Secondary | ICD-10-CM

## 2024-02-24 DIAGNOSIS — I700 Atherosclerosis of aorta: Secondary | ICD-10-CM | POA: Diagnosis present

## 2024-02-24 DIAGNOSIS — K219 Gastro-esophageal reflux disease without esophagitis: Secondary | ICD-10-CM | POA: Diagnosis present

## 2024-02-24 DIAGNOSIS — I739 Peripheral vascular disease, unspecified: Secondary | ICD-10-CM | POA: Diagnosis present

## 2024-02-24 DIAGNOSIS — I4891 Unspecified atrial fibrillation: Principal | ICD-10-CM | POA: Diagnosis present

## 2024-02-24 DIAGNOSIS — Z86718 Personal history of other venous thrombosis and embolism: Secondary | ICD-10-CM

## 2024-02-24 DIAGNOSIS — Z833 Family history of diabetes mellitus: Secondary | ICD-10-CM

## 2024-02-24 DIAGNOSIS — Z9079 Acquired absence of other genital organ(s): Secondary | ICD-10-CM

## 2024-02-24 DIAGNOSIS — E7800 Pure hypercholesterolemia, unspecified: Secondary | ICD-10-CM | POA: Diagnosis present

## 2024-02-24 DIAGNOSIS — S0990XA Unspecified injury of head, initial encounter: Secondary | ICD-10-CM

## 2024-02-24 DIAGNOSIS — Z7982 Long term (current) use of aspirin: Secondary | ICD-10-CM

## 2024-02-24 DIAGNOSIS — W010XXA Fall on same level from slipping, tripping and stumbling without subsequent striking against object, initial encounter: Secondary | ICD-10-CM | POA: Diagnosis present

## 2024-02-24 DIAGNOSIS — Z7189 Other specified counseling: Secondary | ICD-10-CM

## 2024-02-24 DIAGNOSIS — Z90722 Acquired absence of ovaries, bilateral: Secondary | ICD-10-CM

## 2024-02-24 DIAGNOSIS — Z8249 Family history of ischemic heart disease and other diseases of the circulatory system: Secondary | ICD-10-CM

## 2024-02-24 DIAGNOSIS — M170 Bilateral primary osteoarthritis of knee: Secondary | ICD-10-CM | POA: Diagnosis present

## 2024-02-24 DIAGNOSIS — Z9181 History of falling: Secondary | ICD-10-CM

## 2024-02-24 DIAGNOSIS — I2699 Other pulmonary embolism without acute cor pulmonale: Secondary | ICD-10-CM | POA: Diagnosis present

## 2024-02-24 DIAGNOSIS — I959 Hypotension, unspecified: Secondary | ICD-10-CM | POA: Diagnosis present

## 2024-02-24 LAB — URINALYSIS, ROUTINE W REFLEX MICROSCOPIC
Bilirubin Urine: NEGATIVE
Glucose, UA: NEGATIVE mg/dL
Hgb urine dipstick: NEGATIVE
Ketones, ur: NEGATIVE mg/dL
Leukocytes,Ua: NEGATIVE
Nitrite: NEGATIVE
Protein, ur: NEGATIVE mg/dL
Specific Gravity, Urine: >1.046 — ABNORMAL HIGH (ref 1.005–1.030)
pH: 5 (ref 5.0–8.0)

## 2024-02-24 LAB — APTT: aPTT: 35 s (ref 24–36)

## 2024-02-24 LAB — BASIC METABOLIC PANEL WITH GFR
Anion gap: 12 (ref 5–15)
BUN: 45 mg/dL — ABNORMAL HIGH (ref 8–23)
CO2: 22 mmol/L (ref 22–32)
Calcium: 9.6 mg/dL (ref 8.9–10.3)
Chloride: 102 mmol/L (ref 98–111)
Creatinine, Ser: 1.24 mg/dL — ABNORMAL HIGH (ref 0.44–1.00)
GFR, Estimated: 41 mL/min — ABNORMAL LOW
Glucose, Bld: 106 mg/dL — ABNORMAL HIGH (ref 70–99)
Potassium: 4.5 mmol/L (ref 3.5–5.1)
Sodium: 136 mmol/L (ref 135–145)

## 2024-02-24 LAB — CBC
HCT: 35.4 % — ABNORMAL LOW (ref 36.0–46.0)
Hemoglobin: 11.6 g/dL — ABNORMAL LOW (ref 12.0–15.0)
MCH: 34.8 pg — ABNORMAL HIGH (ref 26.0–34.0)
MCHC: 32.8 g/dL (ref 30.0–36.0)
MCV: 106.3 fL — ABNORMAL HIGH (ref 80.0–100.0)
Platelets: 379 K/uL (ref 150–400)
RBC: 3.33 MIL/uL — ABNORMAL LOW (ref 3.87–5.11)
RDW: 15.2 % (ref 11.5–15.5)
WBC: 7 K/uL (ref 4.0–10.5)
nRBC: 0 % (ref 0.0–0.2)

## 2024-02-24 LAB — TROPONIN T, HIGH SENSITIVITY
Troponin T High Sensitivity: 47 ng/L — ABNORMAL HIGH (ref 0–19)
Troponin T High Sensitivity: 38 ng/L — ABNORMAL HIGH (ref 0–19)

## 2024-02-24 LAB — PRO BRAIN NATRIURETIC PEPTIDE: Pro Brain Natriuretic Peptide: 7824 pg/mL — ABNORMAL HIGH

## 2024-02-24 LAB — TSH: TSH: 1.59 u[IU]/mL (ref 0.350–4.500)

## 2024-02-24 MED ORDER — DIGOXIN 0.25 MG/ML IJ SOLN
0.5000 mg | Freq: Once | INTRAMUSCULAR | Status: AC
  Administered 2024-02-24: 0.5 mg via INTRAVENOUS
  Filled 2024-02-24 (×3): qty 2

## 2024-02-24 MED ORDER — IPRATROPIUM-ALBUTEROL 0.5-2.5 (3) MG/3ML IN SOLN: 3.0000 mL | Freq: Four times a day (QID) | RESPIRATORY_TRACT | Status: DC | PRN

## 2024-02-24 MED ORDER — ACETAMINOPHEN 650 MG RE SUPP: 650.0000 mg | Freq: Four times a day (QID) | RECTAL | Status: DC | PRN

## 2024-02-24 MED ORDER — IOHEXOL 350 MG/ML SOLN
75.0000 mL | Freq: Once | INTRAVENOUS | Status: AC | PRN
  Administered 2024-02-24: 75 mL via INTRAVENOUS

## 2024-02-24 MED ORDER — POLYETHYLENE GLYCOL 3350 17 G PO PACK: 17.0000 g | PACK | Freq: Every day | ORAL | Status: DC | PRN

## 2024-02-24 MED ORDER — SODIUM CHLORIDE 0.9 % IV BOLUS
500.0000 mL | Freq: Once | INTRAVENOUS | Status: AC
  Administered 2024-02-24: 500 mL via INTRAVENOUS

## 2024-02-24 MED ORDER — ACETAMINOPHEN 325 MG PO TABS
650.0000 mg | ORAL_TABLET | Freq: Four times a day (QID) | ORAL | Status: DC | PRN
  Administered 2024-02-24: 650 mg via ORAL
  Filled 2024-02-24: qty 2

## 2024-02-24 MED ORDER — FENTANYL CITRATE (PF) 50 MCG/ML IJ SOSY: 12.5000 ug | PREFILLED_SYRINGE | INTRAMUSCULAR | Status: DC | PRN

## 2024-02-24 MED ORDER — DILTIAZEM HCL-DEXTROSE 125-5 MG/125ML-% IV SOLN (PREMIX)
5.0000 mg/h | INTRAVENOUS | Status: DC
  Administered 2024-02-24: 5 mg/h via INTRAVENOUS
  Filled 2024-02-24: qty 125

## 2024-02-24 MED ORDER — HEPARIN (PORCINE) 25000 UT/250ML-% IV SOLN
1000.0000 [IU]/h | INTRAVENOUS | Status: DC
  Administered 2024-02-24 – 2024-02-25 (×2): 1000 [IU]/h via INTRAVENOUS
  Filled 2024-02-24 (×2): qty 250

## 2024-02-24 MED ORDER — SODIUM CHLORIDE 0.9 % IV BOLUS
1000.0000 mL | Freq: Once | INTRAVENOUS | Status: AC
  Administered 2024-02-24: 1000 mL via INTRAVENOUS

## 2024-02-24 MED ORDER — AMIODARONE LOAD VIA INFUSION
150.0000 mg | Freq: Once | INTRAVENOUS | Status: AC
  Administered 2024-02-24: 150 mg via INTRAVENOUS
  Filled 2024-02-24: qty 83.34

## 2024-02-24 MED ORDER — ONDANSETRON HCL 4 MG/2ML IJ SOLN: 4.0000 mg | Freq: Four times a day (QID) | INTRAMUSCULAR | Status: DC | PRN

## 2024-02-24 MED ORDER — AMIODARONE HCL IN DEXTROSE 360-4.14 MG/200ML-% IV SOLN
60.0000 mg/h | INTRAVENOUS | Status: AC
  Administered 2024-02-24 – 2024-02-25 (×3): 60 mg/h via INTRAVENOUS
  Filled 2024-02-24: qty 200

## 2024-02-24 MED ORDER — ONDANSETRON HCL 4 MG PO TABS: 4.0000 mg | ORAL_TABLET | Freq: Four times a day (QID) | ORAL | Status: DC | PRN

## 2024-02-24 MED ORDER — AMIODARONE HCL IN DEXTROSE 360-4.14 MG/200ML-% IV SOLN
30.0000 mg/h | INTRAVENOUS | Status: DC
  Administered 2024-02-25 – 2024-02-26 (×3): 30 mg/h via INTRAVENOUS
  Filled 2024-02-24 (×3): qty 200

## 2024-02-24 MED ORDER — DILTIAZEM HCL 25 MG/5ML IV SOLN
15.0000 mg | Freq: Once | INTRAVENOUS | Status: AC
  Administered 2024-02-24: 15 mg via INTRAVENOUS
  Filled 2024-02-24: qty 5

## 2024-02-24 MED ORDER — HEPARIN BOLUS VIA INFUSION
4000.0000 [IU] | Freq: Once | INTRAVENOUS | Status: AC
  Administered 2024-02-24: 4000 [IU] via INTRAVENOUS
  Filled 2024-02-24: qty 4000

## 2024-02-24 NOTE — ED Notes (Signed)
 Cardizem  Infusion paused at this time. BP 83/60. HR 132. Pt also complaining of right hand numbness/ tingling. MD informed at this time.

## 2024-02-24 NOTE — ED Provider Notes (Signed)
 "  [facility] Provider Note    Event Date/Time   First MD Initiated Contact with Patient 02/24/24 1045     (approximate)   History   Fall   HPI  Brandy Orr is a 89 y.o. female who presents today for evaluation after mechanical fall.  Patient reports that she tripped and fell in her room today and hit her head.  She reports that she remembers the entire event.  There was no LOC.  She denies having felt dizzy or lightheaded prior to the fall.  She reports that after she fell she developed chest pain which she reports is still a little bit there.  She denies shortness of breath.  She reports that she has had swelling in her bilateral lower legs for the past several weeks, she feels that her swelling has worsened since she became part of an assisted living facility because she is more sedentary than she used to be.  She denies history of blood clot.  Denies history of heart problems.  Patient Active Problem List   Diagnosis Date Noted   A-fib (HCC) 02/24/2024   Pure hypercholesterolemia 04/08/2022   CKD (chronic kidney disease) stage 3, GFR 30-59 ml/min (HCC) 04/08/2022   Generalized osteoarthritis 03/26/2021   GERD (gastroesophageal reflux disease) 03/26/2021   Aortic atherosclerosis 03/26/2021          Physical Exam   Triage Vital Signs: ED Triage Vitals  Encounter Vitals Group     BP 02/24/24 0940 118/69     Girls Systolic BP Percentile --      Girls Diastolic BP Percentile --      Boys Systolic BP Percentile --      Boys Diastolic BP Percentile --      Pulse Rate 02/24/24 0940 (!) 160     Resp 02/24/24 0940 18     Temp 02/24/24 0940 98.9 F (37.2 C)     Temp src --      SpO2 02/24/24 0940 98 %     Weight 02/24/24 0941 135 lb (61.2 kg)     Height 02/24/24 0941 5' 7 (1.702 m)     Head Circumference --      Peak Flow --      Pain Score 02/24/24 0941 3     Pain Loc --      Pain Education --      Exclude from Growth Chart --      Most recent vital signs: Vitals:   02/24/24 1105 02/24/24 1230  BP:  111/79  Pulse: (!) 138 (!) 129  Resp: 20 12  Temp:    SpO2: 97% 98%    Physical Exam Vitals and nursing note reviewed.  Constitutional:      General: Awake and alert. No acute distress.    Appearance: Normal appearance. The patient is normal weight.  HENT:     Head: Normocephalic.  Ecchymosis noted to the right temporal area.  No open wounds.  No facial swelling.    Mouth: Mucous membranes are moist.  Eyes:     General: PERRL. Normal EOMs        Right eye: No discharge.        Left eye: No discharge.     Conjunctiva/sclera: Conjunctivae normal.  Cardiovascular:     Rate and Rhythm: Tachycardic rate and irregular rhythm.     Pulses: Normal pulses.  Pulmonary:     Effort: Pulmonary effort is normal. No respiratory distress.  Breath sounds: Normal breath sounds.  Abdominal:     Abdomen is soft. There is no abdominal tenderness. No rebound or guarding. No distention. Musculoskeletal:        General: No swelling. Normal range of motion.     Cervical back: Normal range of motion and neck supple.  Bilateral lower extremity edema present Skin:    General: Skin is warm and dry.     Capillary Refill: Capillary refill takes less than 2 seconds.     Findings: No rash.  Neurological:     Mental Status: The patient is awake and alert.      ED Results / Procedures / Treatments   Labs (all labs ordered are listed, but only abnormal results are displayed) Labs Reviewed  CBC - Abnormal; Notable for the following components:      Result Value   RBC 3.33 (*)    Hemoglobin 11.6 (*)    HCT 35.4 (*)    MCV 106.3 (*)    MCH 34.8 (*)    All other components within normal limits  BASIC METABOLIC PANEL WITH GFR - Abnormal; Notable for the following components:   Glucose, Bld 106 (*)    BUN 45 (*)    Creatinine, Ser 1.24 (*)    GFR, Estimated 41 (*)    All other components within normal limits  PRO BRAIN  NATRIURETIC PEPTIDE - Abnormal; Notable for the following components:   Pro Brain Natriuretic Peptide 7,824.0 (*)    All other components within normal limits  TROPONIN T, HIGH SENSITIVITY - Abnormal; Notable for the following components:   Troponin T High Sensitivity 47 (*)    All other components within normal limits  TROPONIN T, HIGH SENSITIVITY - Abnormal; Notable for the following components:   Troponin T High Sensitivity 38 (*)    All other components within normal limits  APTT  URINALYSIS, ROUTINE W REFLEX MICROSCOPIC  HEPARIN  LEVEL (UNFRACTIONATED)     EKG     RADIOLOGY I independently reviewed and interpreted imaging and agree with radiologists findings.     PROCEDURES:  Critical Care performed:   .Critical Care  Performed by: Corin Formisano E, PA-C Authorized by: Lucille Witts E, PA-C   Critical care provider statement:    Critical care time (minutes):  40   Critical care was necessary to treat or prevent imminent or life-threatening deterioration of the following conditions:  Circulatory failure and cardiac failure   Critical care was time spent personally by me on the following activities:  Development of treatment plan with patient or surrogate, discussions with consultants, evaluation of patient's response to treatment, examination of patient, ordering and review of laboratory studies, ordering and review of radiographic studies, ordering and performing treatments and interventions, pulse oximetry, re-evaluation of patient's condition, review of old charts and obtaining history from patient or surrogate   I assumed direction of critical care for this patient from another provider in my specialty: no     Care discussed with: admitting provider      MEDICATIONS ORDERED IN ED: Medications  acetaminophen  (TYLENOL ) tablet 650 mg (has no administration in time range)    Or  acetaminophen  (TYLENOL ) suppository 650 mg (has no administration in time range)  fentaNYL   (SUBLIMAZE ) injection 12.5 mcg (has no administration in time range)  polyethylene glycol (MIRALAX  / GLYCOLAX ) packet 17 g (has no administration in time range)  ondansetron  (ZOFRAN ) tablet 4 mg (has no administration in time range)    Or  ondansetron  (ZOFRAN )  injection 4 mg (has no administration in time range)  heparin  ADULT infusion 100 units/mL (25000 units/250mL) (1,000 Units/hr Intravenous New Bag/Given 02/24/24 1402)  diltiazem  (CARDIZEM ) injection 15 mg (15 mg Intravenous Given 02/24/24 1124)  sodium chloride  0.9 % bolus 1,000 mL (0 mLs Intravenous Stopped 02/24/24 1228)  iohexol  (OMNIPAQUE ) 350 MG/ML injection 75 mL (75 mLs Intravenous Contrast Given 02/24/24 1113)  heparin  bolus via infusion 4,000 Units (4,000 Units Intravenous Bolus from Bag 02/24/24 1402)     IMPRESSION / MDM / ASSESSMENT AND PLAN / ED COURSE  I reviewed the triage vital signs and the nursing notes.   Differential diagnosis includes, but is not limited to, A-fib with RVR, SVT, contusion, intracranial hemorrhage, pulmonary embolism, acute coronary syndrome.  Patient is awake and alert, tachycardic to the 160s on arrival, though normotensive and afebrile.  As I am speaking to the patient, her heart rate is between 150s and 190s.  She was placed on the cardiac monitor.  EKG obtained upon arrival and suggestive of A-fib with RVR.  Patient denies history of A-fib.  Labs obtained in triage prior to her being roomed are revealing for an elevated troponin to 47 and elevated proBNP to 7824.  CT head and neck obtained per [place] criteria at the time of triage were negative for any acute findings.  I also added on a CT angiogram for evaluation of pulmonary embolism given her significant tachycardia.  She denies having ever had a history of blood clot, though per chart review I see that she had an IVC filter removed in 2016.  Patient is not on any rate controlling agents at home.  For her new onset A-fib, patient was first given  a push of diltiazem .  This improved her heart rate temporarily.  However, patient's heart rate returned to 120s to 140s.  She was started on a diltiazem  drip.  CT PE reveals no acute pulmonary embolus, though it does reveal a pulmonary thrombosis.  I discussed this finding with Dr. Marea with vascular surgery who recommends heparin  drip.  He agrees with admission to the hospitalist service.  I consulted the hospitalist for admission.  Patient was accepted by Dr. Paudel.  Case was discussed with Dr. Viviann who agrees with assessment and plan  Patient's presentation is most consistent with acute presentation with potential threat to life or bodily function.   Clinical Course as of 02/24/24 1416  Fri Feb 24, 2024  1214 Discussed with Dr. Marea who recommends heparin  and admission [JP]    Clinical Course User Index [JP] Kahleah Crass E, PA-C     FINAL CLINICAL IMPRESSION(S) / ED DIAGNOSES   Final diagnoses:  New onset a-fib [facility])  Atrial fibrillation with rapid ventricular response (HCC)  Pulmonary thrombosis (HCC)  Minor head injury, initial encounter  Fall, initial encounter  Contusion of face, initial encounter     Rx / DC Orders   ED Discharge Orders     None        Note:  This document was prepared using Dragon voice recognition software and may include unintentional dictation errors.   Reeda Soohoo E, PA-C 02/24/24 1416    Viviann Pastor, MD 02/24/24 1417  "

## 2024-02-24 NOTE — ED Triage Notes (Signed)
 Pt reports was washing up when lost balance and fell. C/o pain to right forehead. Unsure of LOC.

## 2024-02-24 NOTE — ED Notes (Signed)
 Waiting for pharmacy to send Amiodarone  bag, not in floor pyxis. Pharmacy called and agreed to send.

## 2024-02-24 NOTE — ED Notes (Signed)
 Called CCMD and added pt to board

## 2024-02-24 NOTE — ED Notes (Addendum)
 Pt BP 90/63 with Cardizem  infusing at 45mL/hr. HR 130-150. MD aware.

## 2024-02-24 NOTE — Consult Note (Signed)
 " CARDIOLOGY CONSULT NOTE               Patient ID: Brandy Orr MRN: [id number] DOB/AGE: [birth year] 89 y.o.  Admit date: 02/24/2024 Referring Physician Dr Nena [facility] Primary Physician Dr. Ophelia Sage Primary Cardiologist  Reason for Consultation atrial fibrillation  HPI: 89 year old female with [place] history of hyperlipidemia GERD previous DVT currently not on anticoagulation because of frequent falls patient had a fall recently and was brought to the emergency room hit her head denies loss of consciousness no dizziness no lightheadedness just lost her balance had some vague chest pain and palpitations was found to be in rapid atrial fibrillation in emergency room with borderline troponins BNP was around 8000 CT of the head unremarkable patient was ruled out for PE cardiology was consulted because of atrial fibrillation which appears to be new.  Review of systems complete and found to be negative unless listed above     Past Medical History:  Diagnosis Date   Arthritis    Cancer (HCC)    skin ca   Duodenal ulcer    Gastric ulcer     Past Surgical History:  Procedure Laterality Date   ABDOMINAL HYSTERECTOMY     CATARACT EXTRACTION, BILATERAL Bilateral 2012   ESOPHAGOGASTRODUODENOSCOPY (EGD) WITH PROPOFOL  N/A 07/12/2014   Procedure: ESOPHAGOGASTRODUODENOSCOPY (EGD) WITH PROPOFOL ;  Surgeon: Lamar ONEIDA Holmes, MD;  Location: [facility] ENDOSCOPY;  Service: Endoscopy;  Laterality: N/A;   JOINT REPLACEMENT Right    THR   LAPAROSCOPIC BILATERAL SALPINGO OOPHERECTOMY Bilateral 01/10/2017   Procedure: LAPAROSCOPIC BILATERAL SALPINGO OOPHORECTOMY;  Surgeon: Verdon Keen, MD;  Location: [facility] ORS;  Service: Gynecology;  Laterality: Bilateral;   PERIPHERAL VASCULAR CATHETERIZATION N/A 07/24/2014   Procedure: IVC Filter Removal;  Surgeon: Cordella KANDICE Shawl, MD;  Location: [facility] INVASIVE CV LAB;  Service: Cardiovascular;  Laterality: N/A;    (Not in a hospital  admission)  Social History   Socioeconomic History   Marital status: Widowed    Spouse name: Not on file   Number of children: 1   Years of education: Not on file   Highest education level: Not on file  Occupational History   Occupation: retired  Tobacco Use   Smoking status: Never   Smokeless tobacco: Never  Vaping Use   Vaping status: Never Used  Substance and Sexual Activity   Alcohol  use: No   Drug use: No   Sexual activity: Not on file  Other Topics Concern   Not on file  Social History Narrative   Not on file   Social Drivers of Health   Tobacco Use: Low Risk (02/24/2024)   Patient History    Smoking Tobacco Use: Never    Smokeless Tobacco Use: Never    Passive Exposure: Not on file  Financial Resource Strain: Low Risk  (12/28/2023)   Received from [facility]   Overall Financial Resource Strain (CARDIA)    Difficulty of Paying Living Expenses: Not hard at all  Food Insecurity: No Food Insecurity (12/28/2023)   Received from [facility]   Epic    Within the past 12 months, you worried that your food would run out before you got the money to buy more.: Never true    Within the past 12 months, the food you bought just didn't last and you didn't have money to get more.: Never true  Transportation Needs: No Transportation Needs (12/28/2023)   Received from [facility]   [facility] - Transportation  In the past 12 months, has lack of transportation kept you from medical appointments or from getting medications?: No    Lack of Transportation (Non-Medical): No  Physical Activity: Inactive (07/16/2022)   Exercise Vital Sign    Days of Exercise per Week: 0 days    Minutes of Exercise per Session: 0 min  Stress: No Stress Concern Present (07/16/2022)   [employer] - Occupational Stress Questionnaire    Feeling of Stress : Only a little  Social Connections: Socially Isolated (07/16/2022)    Social Connection and Isolation Panel    Frequency of Communication with Friends and Family: Once a week    Frequency of Social Gatherings with Friends and Family: Never    Attends Religious Services: Never    Database Administrator or Organizations: No    Attends Banker Meetings: Never    Marital Status: Widowed  Intimate Partner Violence: Not At Risk (07/16/2022)   Humiliation, Afraid, Rape, and Kick questionnaire    Fear of Current or Ex-Partner: No    Emotionally Abused: No    Physically Abused: No    Sexually Abused: No  Depression (PHQ2-9): High Risk (10/14/2022)   Depression (PHQ2-9)    PHQ-2 Score: 16  Alcohol  Screen: Low Risk (07/16/2022)   Alcohol  Screen    Last Alcohol  Screening Score (AUDIT): 0  Housing: Low Risk  (12/28/2023)   Received from [facility]   Epic    In the last 12 months, was there a time when you were not able to pay the mortgage or rent on time?: No    In the past 12 months, how many times have you moved where you were living?: 0    At any time in the past 12 months, were you homeless or living in a shelter (including now)?: No  Utilities: Not At Risk (12/28/2023)   Received from [facility]   Epic    In the past 12 months has the electric, gas, oil, or water company threatened to shut off services in your home?: No  Health Literacy: Not on file    Family History  Problem Relation Age of Onset   Leukemia Mother    Diabetes Mother    Heart disease Father    Diabetes Father       Review of systems complete and found to be negative unless listed above      PHYSICAL EXAM  General: Well developed, well nourished, in no acute distress HEENT:  Normocephalic and atramatic Neck:  No JVD.  Lungs: Clear bilaterally to auscultation and percussion. Heart: Irregular irregular. Normal S1 and S2 without gallops or murmurs.  Abdomen: Bowel sounds are positive, abdomen soft and non-tender  Msk:  Back normal,  normal gait. Normal strength and tone for age. Extremities: No clubbing, cyanosis or edema.   Neuro: Alert and oriented X 3. Psych:  Good affect, responds appropriately  Labs:   Lab Results  Component Value Date   WBC 7.0 02/24/2024   HGB 11.6 (L) 02/24/2024   HCT 35.4 (L) 02/24/2024   MCV 106.3 (H) 02/24/2024   PLT 379 02/24/2024    Recent Labs  Lab 02/24/24 0944  NA 136  K 4.5  CL 102  CO2 22  BUN 45*  CREATININE 1.24*  CALCIUM 9.6  GLUCOSE 106*   No results found for: CKTOTAL, CKMB, CKMBINDEX, TROPONINI  Lab Results  Component Value Date   CHOL 228 (H) 10/19/2022  CHOL 228 (H) 04/13/2022   CHOL 236 (H) 10/05/2021   Lab Results  Component Value Date   HDL 71 10/19/2022   HDL 76 04/13/2022   HDL 76 10/05/2021   Lab Results  Component Value Date   LDLCALC 135 (H) 10/19/2022   LDLCALC 126 (H) 04/13/2022   LDLCALC 137 (H) 10/05/2021   Lab Results  Component Value Date   TRIG 114 10/19/2022   TRIG 147 04/13/2022   TRIG 109 10/05/2021   Lab Results  Component Value Date   CHOLHDL 3.2 10/19/2022   CHOLHDL 3.0 04/13/2022   CHOLHDL 3.1 10/05/2021   No results found for: LDLDIRECT    Radiology: CT Angio Chest PE W and/or Wo Contrast Result Date: 02/24/2024 EXAM: CTA CHEST 02/24/2024 11:21:47 AM TECHNIQUE: CTA of the chest was performed after the administration of intravenous contrast. Multiplanar reformatted images are provided for review. MIP images are provided for review. Automated exposure control, iterative reconstruction, and/or weight based adjustment of the mA/kV was utilized to reduce the radiation dose to as low as reasonably achievable. COMPARISON: None available. CLINICAL HISTORY: Pulmonary embolism (PE) suspected, high prob. FINDINGS: PULMONARY ARTERIES: Pulmonary arteries are adequately opacified for evaluation. No acute pulmonary embolus. Main pulmonary artery is normal in caliber. MEDIASTINUM: The heart and pericardium demonstrate no  acute abnormality. There is also mild-to-moderate calcific coronary artery disease. The thoracic aorta demonstrates moderate calcific atheromatous disease. LYMPH NODES: No mediastinal, hilar or axillary lymphadenopathy. LUNGS AND PLEURA: There does appear to be pulmonary venous thrombosis within the left lower lobe. The left lower lobe pulmonary vein is significantly more distended than the right. The right pulmonary vein is also opacified with contrast whereas the left lower lobe pulmonary vein appears to have an intraluminal filling defect, particularly on image 100 of sagittal series 8. There is also a similar finding on image 99 of series 4. There are ground-glass and reticular opacities present dependently within the lungs bilaterally. There are tiny bilateral pleural effusions lying dependently. No pneumothorax. UPPER ABDOMEN: Limited images of the upper abdomen are unremarkable. SOFT TISSUES AND BONES: No acute bone or soft tissue abnormality. IMPRESSION: 1. No evidence of pulmonary embolism. 2. Probable pulmonary venous thrombosis within the left lower lobe pulmonary vein (distended vein with intraluminal filling defect). Um Recommend clinical correlation and management, including evaluation for underlying provoking factor, and consider short-interval follow-up chest CTA (or CT venography) to document resolution. Electronically signed by: Evalene Coho MD 02/24/2024 11:46 AM EST RP Workstation: [id number]   CT Cervical Spine Wo Contrast Result Date: 02/24/2024 EXAM: CT CERVICAL SPINE WITHOUT CONTRAST 02/24/2024 11:21:47 AM TECHNIQUE: CT of the cervical spine was performed without the administration of intravenous contrast. Multiplanar reformatted images are provided for review. Automated exposure control, iterative reconstruction, and/or weight based adjustment of the mA/kV was utilized to reduce the radiation dose to as low as reasonably achievable. COMPARISON: None available. CLINICAL HISTORY:  Polytrauma, blunt. FINDINGS: BONES AND ALIGNMENT: Reversal of the normal cervical lordosis. Mild degenerative anterolisthesis at C2-C3, C3-C4, C4-C5, and C7-T1. No acute fracture or traumatic malalignment. DEGENERATIVE CHANGES: At C3-C4, there is moderate disc space narrowing and bilateral uncovertebral joint hypertrophy, resulting in moderate to severe bilateral neural foraminal stenosis. There is moderate diffuse cervical facet arthrosis. The right facets at C2-C3 are fused. The left facets at C4-C5 are also fused. SOFT TISSUES: No prevertebral soft tissue swelling. IMPRESSION: 1. No evidence of acute traumatic injury. 2. Reversal of the normal cervical lordosis with mild degenerative anterolisthesis at C2-C3, C3-C4,  C4-5, and C7-T1. 3. Moderate disc space narrowing and bilateral uncovertebral joint hypertrophy at C3-4, resulting in moderate to severe bilateral neural foraminal stenosis. 4. Moderate diffuse cervical facet arthrosis. Right facets at C2-3 and left facets at C4-5 are fused. Electronically signed by: Evalene Coho MD 02/24/2024 11:34 AM EST RP Workstation: [id number]   CT Head Wo Contrast Result Date: 02/24/2024 EXAM: CT HEAD WITHOUT CONTRAST 02/24/2024 11:21:47 AM TECHNIQUE: CT of the head was performed without the administration of intravenous contrast. Automated exposure control, iterative reconstruction, and/or weight based adjustment of the mA/kV was utilized to reduce the radiation dose to as low as reasonably achievable. COMPARISON: 11/23/2023 CLINICAL HISTORY: Head trauma, moderate-severe. FINDINGS: BRAIN AND VENTRICLES: Age-related cerebral volume loss and periventricular white matter hypoattenuation consistent with chronic small vessel ischemic change. Mild calcific atheromatous disease within carotid siphons. No acute hemorrhage. No evidence of acute infarct. No hydrocephalus. No extra-axial collection. No mass effect or midline shift. ORBITS: Bilateral lens replacement. No acute  abnormality. SINUSES: Frothy secretions within right sphenoid sinus. SOFT TISSUES AND SKULL: No acute soft tissue abnormality. No skull fracture. IMPRESSION: 1. No acute intracranial abnormality. 2. Age-related cerebral volume loss and periventricular white matter hypoattenuation consistent with chronic small vessel ischemic change. 3. Frothy secretions within the right sphenoid sinus, correlate for sinusitis. Electronically signed by: Evalene Coho MD 02/24/2024 11:30 AM EST RP Workstation: [id number]    EKG: Atrial fibrillation rapid ventricular sponsor rate of 130 narrow complex  ASSESSMENT AND PLAN:  Atrial fibrillation RVR Relative hypotension History of DVT not on anticoagulation Hyperlipidemia Frequent falls History of GERD Peripheral vascular disease . Plan Recommend short-term anticoagulation with heparin  while in atrial fibrillation Consider antiarrhythmic therapy possibly amiodarone  beta-blocker digoxin  okay to continue diltiazem  until LV function has been assessed Echocardiogram for assessment left ventricular function wall motion and valvular structure Patient not likely to benefit from long-term anticoagulation because of frequent falls and the risk associated Continue blood pressure support consider midodrine  if she remains hypotensive Do not recommend any invasive cardiac procedures at this stage  Signed: Cara JONETTA Lovelace MD, 02/24/2024, 7:35 PM      "

## 2024-02-24 NOTE — ED Triage Notes (Signed)
 First nurse note: pt to ED [facility] from [facility]. Fell in bathroom, +hit head, VSS with EMS. Bilateral edema to legs. No blood thinner use.

## 2024-02-24 NOTE — ED Notes (Signed)
 Pt placed on bedpan

## 2024-02-24 NOTE — ED Notes (Addendum)
 Waiting for pharmacy to send Amiodarone  bag, not in floor pyxis. Pharmacy messaged.

## 2024-02-24 NOTE — Progress Notes (Signed)
 PHARMACY - ANTICOAGULATION CONSULT NOTE  Pharmacy Consult for heparin  infusion Indication: pulmonary embolus  Allergies[1]  Patient Measurements: Height: 5' 7 (170.2 cm) Weight: 61.2 kg (135 lb) IBW/kg (Calculated) : 61.6 HEPARIN  DW (KG): 61.2  Vital Signs: Temp: 98.9 F (37.2 C) (01/16 0940) BP: 111/79 (01/16 1230) Pulse Rate: 129 (01/16 1230)  Labs: Recent Labs    02/24/24 0944 02/24/24 1209  HGB 11.6*  --   HCT 35.4*  --   PLT 379  --   APTT  --  35  CREATININE 1.24*  --     Estimated Creatinine Clearance: 29.7 mL/min (A) (by C-G formula based on SCr of 1.24 mg/dL (H)).   Medical History: Past Medical History:  Diagnosis Date   Arthritis    Cancer (HCC)    skin ca   Duodenal ulcer    Gastric ulcer     Medications:  Scheduled:  Infusions:  PRN: acetaminophen  **OR** acetaminophen , fentaNYL  (SUBLIMAZE ) injection, ondansetron  **OR** ondansetron  (ZOFRAN ) IV, polyethylene glycol  Assessment:  89 y.o. female PMH osteoarthritis, CKD, hyperlipidemia, prior right hip replacement (2015) presents for LOC. A review of medical records reveals no chronic anticoagulation prior to arrival. CT angio shows probable pulmonary venous thrombosis within the left lower lobe pulmonary vein.   Baseline Labs: aPTT 35s, Hgb 11.6, PLT wnl, INR pending  Goal of Therapy:  anti-Xa level 0.3-0.7 units/ml Monitor platelets by anticoagulation protocol: Yes   Plan:  --will give 4000 units IV heparin  bolus x 1 --Start heparin  infusion at 1000 units/hr --Check anti-Xa level in 8 hours  --Continue to monitor H&H and platelets  Adriana JONETTA Bolster 02/24/2024,1:11 PM      [1]  Allergies Allergen Reactions   Oxycodone Nausea And Vomiting   Hydrocodone Nausea And Vomiting

## 2024-02-24 NOTE — H&P (Signed)
 "  History and Physical    Brandy Orr FMW:[id number] DOB: 1932/06/17 DOA: 02/24/2024  DOS: the patient was seen and examined on 02/24/2024  PCP: Fernande Ophelia JINNY DOUGLAS, MD   Patient coming from: ALF  I have personally briefly reviewed patient's old medical records in [facility]  Chief Complaint: Fall  HPI: Brandy Orr is a pleasant 89 y.o. female with medical history significant for HLD, GERD, history of DVT in 2016 not on anticoagulation who was brought in from assisted living facility after a fall while she was taking a bath.  Patient reported that she tripped and fell in her bathroom and hit her head.  She reports that she remembers the entire event.  She denied any loss of consciousness.  She denied having dizziness or lightheadedness prior to the fall.  After she fell she stated that she only had chest pain and some palpitations.  She denies any shortness of breath, nausea, vomiting, abdominal pain, diarrhea, hematemesis, melena.  She denies any history of blood clot.  She denies any history of heart problems.  She denies any fever, chills.  ED Course: Upon arrival to the ED, patient is found to be in A-fib with RVR, and pulmonary thrombus, troponin 47, elevated BNP of 7824, head and neck CT scan within normal, CT angio of the chest showed pulmonary thrombosis.  EDP discussed with Dr. Marea from vascular surgery who recommended heparin  drip.  Patient was started on heparin  drip.  And hospitalist service was consulted for evaluation for admission.  Review of Systems:  ROS  All other systems negative except as noted in the HPI.  Past Medical History:  Diagnosis Date   Arthritis    Cancer (HCC)    skin ca   Duodenal ulcer    Gastric ulcer     Past Surgical History:  Procedure Laterality Date   ABDOMINAL HYSTERECTOMY     CATARACT EXTRACTION, BILATERAL Bilateral 2012   ESOPHAGOGASTRODUODENOSCOPY (EGD) WITH PROPOFOL  N/A 07/12/2014   Procedure: ESOPHAGOGASTRODUODENOSCOPY  (EGD) WITH PROPOFOL ;  Surgeon: Lamar ONEIDA Holmes, MD;  Location: [facility] ENDOSCOPY;  Service: Endoscopy;  Laterality: N/A;   JOINT REPLACEMENT Right    THR   LAPAROSCOPIC BILATERAL SALPINGO OOPHERECTOMY Bilateral 01/10/2017   Procedure: LAPAROSCOPIC BILATERAL SALPINGO OOPHORECTOMY;  Surgeon: Verdon Keen, MD;  Location: [facility] ORS;  Service: Gynecology;  Laterality: Bilateral;   PERIPHERAL VASCULAR CATHETERIZATION N/A 07/24/2014   Procedure: IVC Filter Removal;  Surgeon: Cordella KANDICE Shawl, MD;  Location: [facility] INVASIVE CV LAB;  Service: Cardiovascular;  Laterality: N/A;     reports that she has never smoked. She has never used smokeless tobacco. She reports that she does not drink alcohol  and does not use drugs.  Allergies[1]  Family History  Problem Relation Age of Onset   Leukemia Mother    Diabetes Mother    Heart disease Father    Diabetes Father     Prior to Admission medications  Medication Sig Start Date End Date Taking? Authorizing Provider  Acetaminophen  Extra Strength 500 MG TABS Take 2 tablets by mouth 3 (three) times daily. 02/16/24  Yes [provider]  cyanocobalamin  (VITAMIN B12) 1000 MCG tablet Take 1,000 mcg by mouth daily. 02/16/24  Yes [provider]  D 1000 25 MCG (1000 UT) capsule Take 1,000 Units by mouth every morning. 02/16/24  Yes [provider]  MIRALAX  17 GM/SCOOP powder Take 17 g by mouth daily as needed. 02/16/24  Yes [provider]  traMADol  (ULTRAM ) 50 MG  tablet Take 25 mg by mouth 2 (two) times daily. 02/16/24  Yes [provider]  acetaminophen  (TYLENOL ) 650 MG CR tablet Take 650-1,300 mg by mouth every 8 (eight) hours as needed for pain.     [provider]  aspirin  EC 81 MG tablet Take 1 tablet (81 mg total) by mouth daily. Swallow whole. Patient not taking: Reported on 10/14/2022 04/08/22   Antonette Angeline ORN, NP  azelastine (OPTIVAR) 0.05 % ophthalmic solution Apply 1 drop to eye every morning. Patient not  taking: Reported on 02/24/2024    [provider]  Glucosamine-Chondroitin 500-400 MG CAPS Take by mouth.    [provider]  traMADol -acetaminophen  (ULTRACET ) 37.5-325 MG tablet Take 1 tablet by mouth every 8 (eight) hours as needed for moderate pain (pain score 4-6). Patient not taking: Reported on 02/24/2024 02/11/23   Antonette Angeline ORN, NP    Physical Exam: Vitals:   02/24/24 0940 02/24/24 0941 02/24/24 1105 02/24/24 1230  BP: 118/69   111/79  Pulse: (!) 160  (!) 138 (!) 129  Resp: 18  20 12   Temp: 98.9 F (37.2 C)     SpO2: 98%  97% 98%  Weight:  61.2 kg    Height:  5' 7 (1.702 m)      Physical Exam   Constitutional: Alert, awake, calm, comfortable HEENT: Neck supple Respiratory: Clear to auscultation B/L, no wheezing, no rales.  Cardiovascular: Regular rate and rhythm, no murmurs / rubs / gallops. No extremity edema. 2+ pedal pulses. No carotid bruits.  Abdomen: Soft, no tenderness, Bowel sounds positive.  Musculoskeletal: no clubbing / cyanosis. Good ROM, no contractures. Normal muscle tone.  Skin: no rashes, lesions, ulcers. Neurologic: CN 2-12 grossly intact. Sensation intact, No focal deficit identified Psychiatric: Alert and oriented x 3. Normal mood.    Labs on Admission: I have personally reviewed following labs and imaging studies  CBC: Recent Labs  Lab 02/24/24 0944  WBC 7.0  HGB 11.6*  HCT 35.4*  MCV 106.3*  PLT 379   Basic Metabolic Panel: Recent Labs  Lab 02/24/24 0944  NA 136  K 4.5  CL 102  CO2 22  GLUCOSE 106*  BUN 45*  CREATININE 1.24*  CALCIUM 9.6   GFR: Estimated Creatinine Clearance: 29.7 mL/min (A) (by C-G formula based on SCr of 1.24 mg/dL (H)). Liver Function Tests: No results for input(s): AST, ALT, ALKPHOS, BILITOT, PROT, ALBUMIN in the last 168 hours. No results for input(s): LIPASE, AMYLASE in the last 168 hours. No results for input(s): AMMONIA in the last 168 hours. Coagulation Profile: No  results for input(s): INR, PROTIME in the last 168 hours. Cardiac Enzymes: No results for input(s): CKTOTAL, CKMB, CKMBINDEX, TROPONINI, TROPONINIHS in the last 168 hours. BNP (last 3 results) No results for input(s): BNP in the last 8760 hours. HbA1C: No results for input(s): HGBA1C in the last 72 hours. CBG: No results for input(s): GLUCAP in the last 168 hours. Lipid Profile: No results for input(s): CHOL, HDL, LDLCALC, TRIG, CHOLHDL, LDLDIRECT in the last 72 hours. Thyroid Function Tests: No results for input(s): TSH, T4TOTAL, FREET4, T3FREE, THYROIDAB in the last 72 hours. Anemia Panel: No results for input(s): VITAMINB12, FOLATE, FERRITIN, TIBC, IRON, RETICCTPCT in the last 72 hours. Urine analysis:    Component Value Date/Time   COLORURINE YELLOW (A) 06/12/2016 1746   APPEARANCEUR CLEAR (A) 06/12/2016 1746   APPEARANCEUR Clear 03/11/2014 1411   LABSPEC 1.015 06/12/2016 1746   LABSPEC 1.008 03/11/2014 1411   PHURINE  6.0 06/12/2016 1746   GLUCOSEU NEGATIVE 06/12/2016 1746   GLUCOSEU Negative 03/11/2014 1411   HGBUR NEGATIVE 06/12/2016 1746   BILIRUBINUR NEGATIVE 06/12/2016 1746   BILIRUBINUR Negative 03/11/2014 1411   KETONESUR 5 (A) 06/12/2016 1746   PROTEINUR NEGATIVE 06/12/2016 1746   NITRITE NEGATIVE 06/12/2016 1746   LEUKOCYTESUR NEGATIVE 06/12/2016 1746   LEUKOCYTESUR Negative 03/11/2014 1411    Radiological Exams on Admission: I have personally reviewed images CT Angio Chest PE W and/or Wo Contrast Result Date: 02/24/2024 EXAM: CTA CHEST 02/24/2024 11:21:47 AM TECHNIQUE: CTA of the chest was performed after the administration of intravenous contrast. Multiplanar reformatted images are provided for review. MIP images are provided for review. Automated exposure control, iterative reconstruction, and/or weight based adjustment of the mA/kV was utilized to reduce the radiation dose to as low as reasonably achievable.  COMPARISON: None available. CLINICAL HISTORY: Pulmonary embolism (PE) suspected, high prob. FINDINGS: PULMONARY ARTERIES: Pulmonary arteries are adequately opacified for evaluation. No acute pulmonary embolus. Main pulmonary artery is normal in caliber. MEDIASTINUM: The heart and pericardium demonstrate no acute abnormality. There is also mild-to-moderate calcific coronary artery disease. The thoracic aorta demonstrates moderate calcific atheromatous disease. LYMPH NODES: No mediastinal, hilar or axillary lymphadenopathy. LUNGS AND PLEURA: There does appear to be pulmonary venous thrombosis within the left lower lobe. The left lower lobe pulmonary vein is significantly more distended than the right. The right pulmonary vein is also opacified with contrast whereas the left lower lobe pulmonary vein appears to have an intraluminal filling defect, particularly on image 100 of sagittal series 8. There is also a similar finding on image 99 of series 4. There are ground-glass and reticular opacities present dependently within the lungs bilaterally. There are tiny bilateral pleural effusions lying dependently. No pneumothorax. UPPER ABDOMEN: Limited images of the upper abdomen are unremarkable. SOFT TISSUES AND BONES: No acute bone or soft tissue abnormality. IMPRESSION: 1. No evidence of pulmonary embolism. 2. Probable pulmonary venous thrombosis within the left lower lobe pulmonary vein (distended vein with intraluminal filling defect). Um Recommend clinical correlation and management, including evaluation for underlying provoking factor, and consider short-interval follow-up chest CTA (or CT venography) to document resolution. Electronically signed by: Evalene Coho MD 02/24/2024 11:46 AM EST RP Workstation: [id number]   CT Cervical Spine Wo Contrast Result Date: 02/24/2024 EXAM: CT CERVICAL SPINE WITHOUT CONTRAST 02/24/2024 11:21:47 AM TECHNIQUE: CT of the cervical spine was performed without the administration  of intravenous contrast. Multiplanar reformatted images are provided for review. Automated exposure control, iterative reconstruction, and/or weight based adjustment of the mA/kV was utilized to reduce the radiation dose to as low as reasonably achievable. COMPARISON: None available. CLINICAL HISTORY: Polytrauma, blunt. FINDINGS: BONES AND ALIGNMENT: Reversal of the normal cervical lordosis. Mild degenerative anterolisthesis at C2-C3, C3-C4, C4-C5, and C7-T1. No acute fracture or traumatic malalignment. DEGENERATIVE CHANGES: At C3-C4, there is moderate disc space narrowing and bilateral uncovertebral joint hypertrophy, resulting in moderate to severe bilateral neural foraminal stenosis. There is moderate diffuse cervical facet arthrosis. The right facets at C2-C3 are fused. The left facets at C4-C5 are also fused. SOFT TISSUES: No prevertebral soft tissue swelling. IMPRESSION: 1. No evidence of acute traumatic injury. 2. Reversal of the normal cervical lordosis with mild degenerative anterolisthesis at C2-C3, C3-C4, C4-5, and C7-T1. 3. Moderate disc space narrowing and bilateral uncovertebral joint hypertrophy at C3-4, resulting in moderate to severe bilateral neural foraminal stenosis. 4. Moderate diffuse cervical facet arthrosis. Right facets at C2-3 and left facets at C4-5 are  fused. Electronically signed by: Evalene Coho MD 02/24/2024 11:34 AM EST RP Workstation: [id number]   CT Head Wo Contrast Result Date: 02/24/2024 EXAM: CT HEAD WITHOUT CONTRAST 02/24/2024 11:21:47 AM TECHNIQUE: CT of the head was performed without the administration of intravenous contrast. Automated exposure control, iterative reconstruction, and/or weight based adjustment of the mA/kV was utilized to reduce the radiation dose to as low as reasonably achievable. COMPARISON: 11/23/2023 CLINICAL HISTORY: Head trauma, moderate-severe. FINDINGS: BRAIN AND VENTRICLES: Age-related cerebral volume loss and periventricular white matter  hypoattenuation consistent with chronic small vessel ischemic change. Mild calcific atheromatous disease within carotid siphons. No acute hemorrhage. No evidence of acute infarct. No hydrocephalus. No extra-axial collection. No mass effect or midline shift. ORBITS: Bilateral lens replacement. No acute abnormality. SINUSES: Frothy secretions within right sphenoid sinus. SOFT TISSUES AND SKULL: No acute soft tissue abnormality. No skull fracture. IMPRESSION: 1. No acute intracranial abnormality. 2. Age-related cerebral volume loss and periventricular white matter hypoattenuation consistent with chronic small vessel ischemic change. 3. Frothy secretions within the right sphenoid sinus, correlate for sinusitis. Electronically signed by: Evalene Coho MD 02/24/2024 11:30 AM EST RP Workstation: [id number]    EKG: My personal interpretation of EKG shows: A-fib with RVR    Assessment/Plan Principal Problem:   A-fib (HCC) Active Problems:   GERD (gastroesophageal reflux disease)   Pure hypercholesterolemia   Fall at home, initial encounter   Thrombus of pulmonary vein (HCC)    Assessment and Plan: This is an 89 year old female assisted-living facility resident came in after a fall this morning.  She was found to have A-fib with RVR and pulmonary thrombus.  1.  New A-fib with RVR/new pulmonary thrombus - Patient is on diltiazem  drip. - This appears to be a new atrial fibrillation as patient does not remember having those in the past - She will be on heparin  drip. - Will have cardiology consult. - She will also be evaluated by vascular surgery per EDP - I will follow the recommendation from cardiology and vascular surgery.  2.  Fall at home. - There is no obvious deep trauma or fractures. - CT head and neck showed no acute fractures - Continue supportive care - PT/OT  3.  GERD/HLD - Resume home medications     DVT prophylaxis: IV heparin  gtts Code Status: DNR/DNI(Do NOT  Intubate) Family Communication: None  Disposition Plan: Back to assisted-living facility Consults called: Cardiology/vascular by EDP Admission status: Observation, progressive   Nena Rebel, MD [facility] 02/24/2024, 2:35 PM        [1]  Allergies Allergen Reactions   Oxycodone Nausea And Vomiting   Hydrocodone Nausea And Vomiting   "

## 2024-02-25 ENCOUNTER — Observation Stay

## 2024-02-25 DIAGNOSIS — Z9079 Acquired absence of other genital organ(s): Secondary | ICD-10-CM | POA: Diagnosis not present

## 2024-02-25 DIAGNOSIS — I959 Hypotension, unspecified: Secondary | ICD-10-CM | POA: Diagnosis present

## 2024-02-25 DIAGNOSIS — I82451 Acute embolism and thrombosis of right peroneal vein: Secondary | ICD-10-CM

## 2024-02-25 DIAGNOSIS — S0083XA Contusion of other part of head, initial encounter: Secondary | ICD-10-CM

## 2024-02-25 DIAGNOSIS — Z8249 Family history of ischemic heart disease and other diseases of the circulatory system: Secondary | ICD-10-CM | POA: Diagnosis not present

## 2024-02-25 DIAGNOSIS — I2699 Other pulmonary embolism without acute cor pulmonale: Secondary | ICD-10-CM

## 2024-02-25 DIAGNOSIS — Z515 Encounter for palliative care: Secondary | ICD-10-CM | POA: Diagnosis not present

## 2024-02-25 DIAGNOSIS — Z7189 Other specified counseling: Secondary | ICD-10-CM | POA: Diagnosis not present

## 2024-02-25 DIAGNOSIS — I4891 Unspecified atrial fibrillation: Secondary | ICD-10-CM | POA: Diagnosis present

## 2024-02-25 DIAGNOSIS — Z993 Dependence on wheelchair: Secondary | ICD-10-CM | POA: Diagnosis not present

## 2024-02-25 DIAGNOSIS — Z833 Family history of diabetes mellitus: Secondary | ICD-10-CM | POA: Diagnosis not present

## 2024-02-25 DIAGNOSIS — Z66 Do not resuscitate: Secondary | ICD-10-CM | POA: Diagnosis present

## 2024-02-25 DIAGNOSIS — Z90722 Acquired absence of ovaries, bilateral: Secondary | ICD-10-CM | POA: Diagnosis not present

## 2024-02-25 DIAGNOSIS — Z90710 Acquired absence of both cervix and uterus: Secondary | ICD-10-CM | POA: Diagnosis not present

## 2024-02-25 DIAGNOSIS — Z885 Allergy status to narcotic agent status: Secondary | ICD-10-CM | POA: Diagnosis not present

## 2024-02-25 DIAGNOSIS — Z9181 History of falling: Secondary | ICD-10-CM | POA: Diagnosis not present

## 2024-02-25 DIAGNOSIS — Z86718 Personal history of other venous thrombosis and embolism: Secondary | ICD-10-CM | POA: Diagnosis not present

## 2024-02-25 DIAGNOSIS — W010XXA Fall on same level from slipping, tripping and stumbling without subsequent striking against object, initial encounter: Secondary | ICD-10-CM | POA: Diagnosis present

## 2024-02-25 DIAGNOSIS — M170 Bilateral primary osteoarthritis of knee: Secondary | ICD-10-CM | POA: Diagnosis present

## 2024-02-25 DIAGNOSIS — Z7901 Long term (current) use of anticoagulants: Secondary | ICD-10-CM | POA: Diagnosis not present

## 2024-02-25 DIAGNOSIS — R296 Repeated falls: Secondary | ICD-10-CM | POA: Diagnosis present

## 2024-02-25 DIAGNOSIS — I739 Peripheral vascular disease, unspecified: Secondary | ICD-10-CM | POA: Diagnosis present

## 2024-02-25 DIAGNOSIS — K219 Gastro-esophageal reflux disease without esophagitis: Secondary | ICD-10-CM | POA: Diagnosis present

## 2024-02-25 DIAGNOSIS — E7800 Pure hypercholesterolemia, unspecified: Secondary | ICD-10-CM | POA: Diagnosis present

## 2024-02-25 DIAGNOSIS — I700 Atherosclerosis of aorta: Secondary | ICD-10-CM | POA: Diagnosis present

## 2024-02-25 DIAGNOSIS — Z7982 Long term (current) use of aspirin: Secondary | ICD-10-CM | POA: Diagnosis not present

## 2024-02-25 LAB — CBC
HCT: 31.3 % — ABNORMAL LOW (ref 36.0–46.0)
Hemoglobin: 10.5 g/dL — ABNORMAL LOW (ref 12.0–15.0)
MCH: 35.1 pg — ABNORMAL HIGH (ref 26.0–34.0)
MCHC: 33.5 g/dL (ref 30.0–36.0)
MCV: 104.7 fL — ABNORMAL HIGH (ref 80.0–100.0)
Platelets: 315 K/uL (ref 150–400)
RBC: 2.99 MIL/uL — ABNORMAL LOW (ref 3.87–5.11)
RDW: 15.4 % (ref 11.5–15.5)
WBC: 6.3 K/uL (ref 4.0–10.5)
nRBC: 0 % (ref 0.0–0.2)

## 2024-02-25 LAB — HEPARIN LEVEL (UNFRACTIONATED)
Heparin Unfractionated: 0.52 [IU]/mL (ref 0.30–0.70)
Heparin Unfractionated: 0.55 [IU]/mL (ref 0.30–0.70)

## 2024-02-25 LAB — COMPREHENSIVE METABOLIC PANEL WITH GFR
ALT: 92 U/L — ABNORMAL HIGH (ref 0–44)
AST: 67 U/L — ABNORMAL HIGH (ref 15–41)
Albumin: 3.3 g/dL — ABNORMAL LOW (ref 3.5–5.0)
Alkaline Phosphatase: 116 U/L (ref 38–126)
Anion gap: 10 (ref 5–15)
BUN: 39 mg/dL — ABNORMAL HIGH (ref 8–23)
CO2: 21 mmol/L — ABNORMAL LOW (ref 22–32)
Calcium: 8.9 mg/dL (ref 8.9–10.3)
Chloride: 106 mmol/L (ref 98–111)
Creatinine, Ser: 1.05 mg/dL — ABNORMAL HIGH (ref 0.44–1.00)
GFR, Estimated: 51 mL/min — ABNORMAL LOW
Glucose, Bld: 105 mg/dL — ABNORMAL HIGH (ref 70–99)
Potassium: 4.5 mmol/L (ref 3.5–5.1)
Sodium: 136 mmol/L (ref 135–145)
Total Bilirubin: 0.3 mg/dL (ref 0.0–1.2)
Total Protein: 5.6 g/dL — ABNORMAL LOW (ref 6.5–8.1)

## 2024-02-25 LAB — PROTIME-INR
INR: 1.1 (ref 0.8–1.2)
Prothrombin Time: 15 s (ref 11.4–15.2)

## 2024-02-25 MED ORDER — MIDODRINE HCL 5 MG PO TABS
5.0000 mg | ORAL_TABLET | Freq: Three times a day (TID) | ORAL | Status: DC
  Administered 2024-02-25: 5 mg via ORAL
  Filled 2024-02-25: qty 1

## 2024-02-25 MED ORDER — DIGOXIN 0.25 MG/ML IJ SOLN
0.2500 mg | Freq: Four times a day (QID) | INTRAMUSCULAR | Status: AC
  Administered 2024-02-25 (×2): 0.25 mg via INTRAVENOUS
  Filled 2024-02-25 (×2): qty 2

## 2024-02-25 MED ORDER — DIGOXIN 0.25 MG/ML IJ SOLN: 0.2500 mg | INTRAMUSCULAR | Status: DC

## 2024-02-25 MED ORDER — TRAMADOL HCL 50 MG PO TABS
25.0000 mg | ORAL_TABLET | Freq: Two times a day (BID) | ORAL | Status: DC
  Administered 2024-02-25 (×2): 25 mg via ORAL
  Filled 2024-02-25 (×2): qty 1

## 2024-02-25 MED ORDER — FUROSEMIDE 10 MG/ML IJ SOLN
20.0000 mg | Freq: Once | INTRAMUSCULAR | Status: AC
  Administered 2024-02-25: 20 mg via INTRAVENOUS
  Filled 2024-02-25: qty 2

## 2024-02-25 MED ORDER — SODIUM CHLORIDE 0.9 % IV BOLUS
500.0000 mL | Freq: Once | INTRAVENOUS | Status: AC
  Administered 2024-02-25: 500 mL via INTRAVENOUS

## 2024-02-25 MED ORDER — VITAMIN B-12 1000 MCG PO TABS
1000.0000 ug | ORAL_TABLET | Freq: Every day | ORAL | Status: DC
  Administered 2024-02-25: 1000 ug via ORAL
  Filled 2024-02-25: qty 1

## 2024-02-25 MED ORDER — DIGOXIN 0.25 MG/ML IJ SOLN
0.5000 mg | Freq: Once | INTRAMUSCULAR | Status: AC
  Administered 2024-02-25: 0.5 mg via INTRAVENOUS
  Filled 2024-02-25: qty 2

## 2024-02-25 MED ORDER — DIGOXIN 0.25 MG/ML IJ SOLN: 0.1250 mg | Freq: Every day | INTRAMUSCULAR | Status: DC

## 2024-02-25 NOTE — Progress Notes (Signed)
 Wound noted on Right lower inner leg, appears to be an laceration. Wound bed dark red in color, 5cm x 2cm, surrounding area pink/red blanchable. Pain noted when touched. Area Cleaned with Vashe, pat dry, xeroform and mepilex applied.Patient stated that she cut her leg on the wheelchair when she fell at [place].

## 2024-02-25 NOTE — Progress Notes (Addendum)
 [facility] Cardiology    SUBJECTIVE: Patient states to be doing okay but still not back to normal no significant chest pain no blackout spells or syncope having frequent falls at [place]   Vitals:   02/25/24 0100 02/25/24 0800 02/25/24 0823 02/25/24 1005  BP:  93/65  134/73  Pulse:  (!) 136    Resp:  20 20   Temp:  97.8 F (36.6 C)    TempSrc:  Oral    SpO2:  95%    Weight: 68.4 kg     Height: 5' 7 (1.702 m)       No intake or output data in the 24 hours ending 02/25/24 1037    PHYSICAL EXAM  General: Well developed, well nourished, in no acute distress HEENT:  Normocephalic and atramatic Neck:  No JVD.  Lungs: Clear bilaterally to auscultation and percussion. Heart: Irregularly irregular   Normal S1 and S2 without gallops or murmurs.  Abdomen: Bowel sounds are positive, abdomen soft and non-tender  Msk:  Back normal, normal gait. Normal strength and tone for age. Extremities: No clubbing, cyanosis or edema.   Neuro: Alert and oriented X 3. Psych:  Good affect, responds appropriately   LABS: Basic Metabolic Panel: Recent Labs    02/24/24 0944 02/25/24 0431  NA 136 136  K 4.5 4.5  CL 102 106  CO2 22 21*  GLUCOSE 106* 105*  BUN 45* 39*  CREATININE 1.24* 1.05*  CALCIUM 9.6 8.9   Liver Function Tests: Recent Labs    02/25/24 0431  AST 67*  ALT 92*  ALKPHOS 116  BILITOT 0.3  PROT 5.6*  ALBUMIN 3.3*   No results for input(s): LIPASE, AMYLASE in the last 72 hours. CBC: Recent Labs    02/24/24 0944 02/25/24 0431  WBC 7.0 6.3  HGB 11.6* 10.5*  HCT 35.4* 31.3*  MCV 106.3* 104.7*  PLT 379 315   Cardiac Enzymes: No results for input(s): CKTOTAL, CKMB, CKMBINDEX, TROPONINI in the last 72 hours. BNP: Invalid input(s): POCBNP D-Dimer: No results for input(s): DDIMER in the last 72 hours. Hemoglobin A1C: No results for input(s): HGBA1C in the last 72 hours. Fasting Lipid Panel: No results for input(s): CHOL, HDL, LDLCALC, TRIG,  CHOLHDL, LDLDIRECT in the last 72 hours. Thyroid Function Tests: Recent Labs    02/24/24 0944  TSH 1.590   Anemia Panel: No results for input(s): VITAMINB12, FOLATE, FERRITIN, TIBC, IRON, RETICCTPCT in the last 72 hours.  CT Angio Chest PE W and/or Wo Contrast Result Date: 02/24/2024 EXAM: CTA CHEST 02/24/2024 11:21:47 AM TECHNIQUE: CTA of the chest was performed after the administration of intravenous contrast. Multiplanar reformatted images are provided for review. MIP images are provided for review. Automated exposure control, iterative reconstruction, and/or weight based adjustment of the mA/kV was utilized to reduce the radiation dose to as low as reasonably achievable. COMPARISON: None available. CLINICAL HISTORY: Pulmonary embolism (PE) suspected, high prob. FINDINGS: PULMONARY ARTERIES: Pulmonary arteries are adequately opacified for evaluation. No acute pulmonary embolus. Main pulmonary artery is normal in caliber. MEDIASTINUM: The heart and pericardium demonstrate no acute abnormality. There is also mild-to-moderate calcific coronary artery disease. The thoracic aorta demonstrates moderate calcific atheromatous disease. LYMPH NODES: No mediastinal, hilar or axillary lymphadenopathy. LUNGS AND PLEURA: There does appear to be pulmonary venous thrombosis within the left lower lobe. The left lower lobe pulmonary vein is significantly more distended than the right. The right pulmonary vein is also opacified with contrast whereas the left lower lobe pulmonary vein appears to  have an intraluminal filling defect, particularly on image 100 of sagittal series 8. There is also a similar finding on image 99 of series 4. There are ground-glass and reticular opacities present dependently within the lungs bilaterally. There are tiny bilateral pleural effusions lying dependently. No pneumothorax. UPPER ABDOMEN: Limited images of the upper abdomen are unremarkable. SOFT TISSUES AND BONES: No  acute bone or soft tissue abnormality. IMPRESSION: 1. No evidence of pulmonary embolism. 2. Probable pulmonary venous thrombosis within the left lower lobe pulmonary vein (distended vein with intraluminal filling defect). Um Recommend clinical correlation and management, including evaluation for underlying provoking factor, and consider short-interval follow-up chest CTA (or CT venography) to document resolution. Electronically signed by: Evalene Coho MD 02/24/2024 11:46 AM EST RP Workstation: [id number]   CT Cervical Spine Wo Contrast Result Date: 02/24/2024 EXAM: CT CERVICAL SPINE WITHOUT CONTRAST 02/24/2024 11:21:47 AM TECHNIQUE: CT of the cervical spine was performed without the administration of intravenous contrast. Multiplanar reformatted images are provided for review. Automated exposure control, iterative reconstruction, and/or weight based adjustment of the mA/kV was utilized to reduce the radiation dose to as low as reasonably achievable. COMPARISON: None available. CLINICAL HISTORY: Polytrauma, blunt. FINDINGS: BONES AND ALIGNMENT: Reversal of the normal cervical lordosis. Mild degenerative anterolisthesis at C2-C3, C3-C4, C4-C5, and C7-T1. No acute fracture or traumatic malalignment. DEGENERATIVE CHANGES: At C3-C4, there is moderate disc space narrowing and bilateral uncovertebral joint hypertrophy, resulting in moderate to severe bilateral neural foraminal stenosis. There is moderate diffuse cervical facet arthrosis. The right facets at C2-C3 are fused. The left facets at C4-C5 are also fused. SOFT TISSUES: No prevertebral soft tissue swelling. IMPRESSION: 1. No evidence of acute traumatic injury. 2. Reversal of the normal cervical lordosis with mild degenerative anterolisthesis at C2-C3, C3-C4, C4-5, and C7-T1. 3. Moderate disc space narrowing and bilateral uncovertebral joint hypertrophy at C3-4, resulting in moderate to severe bilateral neural foraminal stenosis. 4. Moderate diffuse cervical  facet arthrosis. Right facets at C2-3 and left facets at C4-5 are fused. Electronically signed by: Evalene Coho MD 02/24/2024 11:34 AM EST RP Workstation: [id number]   CT Head Wo Contrast Result Date: 02/24/2024 EXAM: CT HEAD WITHOUT CONTRAST 02/24/2024 11:21:47 AM TECHNIQUE: CT of the head was performed without the administration of intravenous contrast. Automated exposure control, iterative reconstruction, and/or weight based adjustment of the mA/kV was utilized to reduce the radiation dose to as low as reasonably achievable. COMPARISON: 11/23/2023 CLINICAL HISTORY: Head trauma, moderate-severe. FINDINGS: BRAIN AND VENTRICLES: Age-related cerebral volume loss and periventricular white matter hypoattenuation consistent with chronic small vessel ischemic change. Mild calcific atheromatous disease within carotid siphons. No acute hemorrhage. No evidence of acute infarct. No hydrocephalus. No extra-axial collection. No mass effect or midline shift. ORBITS: Bilateral lens replacement. No acute abnormality. SINUSES: Frothy secretions within right sphenoid sinus. SOFT TISSUES AND SKULL: No acute soft tissue abnormality. No skull fracture. IMPRESSION: 1. No acute intracranial abnormality. 2. Age-related cerebral volume loss and periventricular white matter hypoattenuation consistent with chronic small vessel ischemic change. 3. Frothy secretions within the right sphenoid sinus, correlate for sinusitis. Electronically signed by: Evalene Coho MD 02/24/2024 11:30 AM EST RP Workstation: [id number]   Telemetry heart rate of 110 atrial fibrillation  Echo pending  TELEMETRY: Atrial fibrillation RVR rate of 120 nonspecific ST-T wave changes:  ASSESSMENT AND PLAN:  Principal Problem:   A-fib (HCC) Active Problems:   GERD (gastroesophageal reflux disease)   Pure hypercholesterolemia   Fall at home, initial encounter   Thrombus of pulmonary vein (HCC)  Plan Agree with admit to telemetry follow-up  EKGs and troponins telemetry and BNP Atrial fibrillation continue amiodarone  digoxin  for rate heparin  for anticoagulation transition possible Eliquis patient with significant fall risk  Hypotension recommend fluid bolus and midodrine  to help with blood pressure support Recommend physical therapy to help with balance and strength training Continue conservative therapy    Cara JONETTA Lovelace, MD 02/25/2024 10:37 AM

## 2024-02-25 NOTE — Progress Notes (Signed)
 OT Cancellation Note  Patient Details Name: Brandy Orr MRN: [id number] DOB: 01-30-1933   Cancelled Treatment:     Re attempted to see pt in the pm.  Currently BP is low and HR still high, 86/51 and 110-125 heart rate.  Nursing contacting MD.  Will continue attempts for OT eval next date.   Geraline Halberstadt T Shakala Marlatt, OTR/L, CLT  Johann Gascoigne 02/25/2024, 2:42 PM

## 2024-02-25 NOTE — Progress Notes (Signed)
 PT Cancellation Note  Patient Details Name: Brandy Orr MRN: [id number] DOB: 04/21/1932   Cancelled Treatment:    Reason Eval/Treat Not Completed: Other (comment) RN rec. PT hold due to elevated HR at baseline. PT will check back at later time.    [name] 02/25/2024, 2:54 PM

## 2024-02-25 NOTE — Plan of Care (Signed)
 HR elevated this AM in the 130s to 150s. Digoxin  added. BP on the lower side this afternoon. See flowsheets. Midodrine  and 500 ml bolus given. Family at bedside this shift. BLE ultrasound done this morning. See results. Vascular saw patient. See note.   Problem: Education: Goal: Knowledge of General Education information will improve Description: Including pain rating scale, medication(s)/side effects and non-pharmacologic comfort measures Outcome: Progressing   Problem: Health Behavior/Discharge Planning: Goal: Ability to manage health-related needs will improve Outcome: Progressing   Problem: Clinical Measurements: Goal: Ability to maintain clinical measurements within normal limits will improve Outcome: Progressing Goal: Will remain free from infection Outcome: Progressing Goal: Diagnostic test results will improve Outcome: Progressing Goal: Respiratory complications will improve Outcome: Progressing Goal: Cardiovascular complication will be avoided Outcome: Progressing   Problem: Activity: Goal: Risk for activity intolerance will decrease Outcome: Progressing   Problem: Nutrition: Goal: Adequate nutrition will be maintained Outcome: Progressing   Problem: Coping: Goal: Level of anxiety will decrease Outcome: Progressing   Problem: Elimination: Goal: Will not experience complications related to bowel motility Outcome: Progressing Goal: Will not experience complications related to urinary retention Outcome: Progressing   Problem: Pain Managment: Goal: General experience of comfort will improve and/or be controlled Outcome: Progressing   Problem: Safety: Goal: Ability to remain free from injury will improve Outcome: Progressing   Problem: Skin Integrity: Goal: Risk for impaired skin integrity will decrease Outcome: Progressing

## 2024-02-25 NOTE — Progress Notes (Signed)
 OT Cancellation Note  Patient Details Name: Brandy Orr MRN: [id number] DOB: April 06, 1932   Cancelled Treatment:     Attempted to see pt this am and pt declined, states she really needs the bedpan to have a BM, declined getting up at the moment to use the [facility].  Nursing in to assist patient with bedpan. Will reattempt later today.  Eugena Rhue T Dj Senteno, OTR/L, CLT   Mohanad Carsten 02/25/2024, 10:06 AM

## 2024-02-25 NOTE — Progress Notes (Signed)
" °   02/25/24 0823  Assess: MEWS Score  Resp 20  Level of Consciousness Alert  Assess: MEWS Score  MEWS Temp 0  MEWS Systolic 1  MEWS Pulse 3  MEWS RR 0  MEWS LOC 0  MEWS Score 4  MEWS Score Color Red  Assess: if the MEWS score is Yellow or Red  Were vital signs accurate and taken at a resting state? Yes  Does the patient meet 2 or more of the SIRS criteria? No  MEWS guidelines implemented  Yes, red  Treat  MEWS Interventions Considered administering scheduled or prn medications/treatments as ordered  Take Vital Signs  Increase Vital Sign Frequency  Red: Q1hr x2, continue Q4hrs until patient remains green for 12hrs  Escalate  MEWS: Escalate Red: Discuss with charge nurse and notify provider. Consider notifying RRT. If remains red for 2 hours consider need for higher level of care  Notify: Charge Nurse/RN  Name of Charge Nurse/RN Notified Erman, RN  Provider Notification  Provider Name/Title Tobie, MD  Date Provider Notified 02/25/24  Time Provider Notified 0825  Method of Notification Call  Notification Reason Other (Comment) (Red MEWS)  Assess: SIRS CRITERIA  SIRS Temperature  0  SIRS Respirations  0  SIRS Pulse 1  SIRS WBC 0  SIRS Score Sum  1    "

## 2024-02-25 NOTE — Progress Notes (Signed)
 [facility]  - [facility]   PATIENT NAME: Brandy Orr    MR#:  [id number]  DATE OF BIRTH:  [birth year]  SUBJECTIVE:  no family at bedside. Patient comes in from assisted living after she had mechanical fall. Found to be in a fib with RVR. Currently on IV amiodarone  drip. Blood pressure soft. Has chronic bilateral lower extremity edema and significant DJD on the knees enabling her to limit ambulation. Uses wheelchair at the facility.    VITALS:  Blood pressure 134/73, pulse (!) 136, temperature 97.8 F (36.6 C), temperature source Oral, resp. rate 20, height 5' 7 (1.702 m), weight 68.4 kg, SpO2 95%.  PHYSICAL EXAMINATION:   GENERAL:  89 y.o.-year-old patient with no acute distress.  LUNGS: decreased  breath sounds bilaterally, no wheezing CARDIOVASCULAR: S1, S2 normal. No murmur  irregular ABDOMEN: Soft, nontender, nondistended. Bowel sounds present.  EXTREMITIES +++edema b/l.   Bilateral DJD knees NEUROLOGIC: nonfocal  patient is alert and awake  LABORATORY PANEL:  CBC Recent Labs  Lab 02/25/24 0431  WBC 6.3  HGB 10.5*  HCT 31.3*  PLT 315    Chemistries  Recent Labs  Lab 02/25/24 0431  NA 136  K 4.5  CL 106  CO2 21*  GLUCOSE 105*  BUN 39*  CREATININE 1.05*  CALCIUM 8.9  AST 67*  ALT 92*  ALKPHOS 116  BILITOT 0.3   Cardiac Enzymes No results for input(s): TROPONINI in the last 168 hours. RADIOLOGY:  US  Venous Img Lower Bilateral (DVT) Result Date: 02/25/2024 CLINICAL DATA:  Pulmonary venous thrombosis EXAM: BILATERAL LOWER EXTREMITY VENOUS DOPPLER ULTRASOUND TECHNIQUE: Gray-scale sonography with graded compression, as well as color Doppler and duplex ultrasound were performed to evaluate the lower extremity deep venous systems from the level of the common femoral vein and including the common femoral, femoral, profunda femoral, popliteal and calf veins including the posterior tibial, peroneal and gastrocnemius veins when  visible. The superficial great saphenous vein was also interrogated. Spectral Doppler was utilized to evaluate flow at rest and with distal augmentation maneuvers in the common femoral, femoral and popliteal veins. COMPARISON:  None available FINDINGS: RIGHT LOWER EXTREMITY Common Femoral Vein: No evidence of thrombus. Normal compressibility, respiratory phasicity and response to augmentation. Saphenofemoral Junction: No evidence of thrombus. Normal compressibility and flow on color Doppler imaging. Profunda Femoral Vein: No evidence of thrombus. Normal compressibility and flow on color Doppler imaging. Femoral Vein: No evidence of thrombus. Normal compressibility, respiratory phasicity and response to augmentation. Popliteal Vein: No evidence of thrombus. Normal compressibility, respiratory phasicity and response to augmentation. Calf Veins: Intramural filling defect with decreased flow and compressibility of the peroneal vein is consistent with acute DVT. Posterior tibial vein is patent. Superficial Great Saphenous Vein: No evidence of thrombus. Normal compressibility. Other Findings:  None. LEFT LOWER EXTREMITY Common Femoral Vein: No evidence of thrombus. Normal compressibility, respiratory phasicity and response to augmentation. Saphenofemoral Junction: No evidence of thrombus. Normal compressibility and flow on color Doppler imaging. Profunda Femoral Vein: No evidence of thrombus. Normal compressibility and flow on color Doppler imaging. Femoral Vein: No evidence of thrombus. Normal compressibility, respiratory phasicity and response to augmentation. Popliteal Vein: No evidence of thrombus. Normal compressibility, respiratory phasicity and response to augmentation. Calf Veins: No evidence of thrombus. Normal compressibility and flow on color Doppler imaging. Superficial Great Saphenous Vein: No evidence of thrombus. Normal compressibility. Other Findings:  None. IMPRESSION: 1. Occlusive deep venous thrombosis  of the RIGHT peroneal vein. 2. No DVT of  the LEFT lower extremity. These results will be called to the ordering clinician or representative by the Radiologist Assistant, and communication documented in the PACS or [employer]. Electronically Signed   By: Aliene Lloyd M.D.   On: 02/25/2024 13:11   CT Angio Chest PE W and/or Wo Contrast Result Date: 02/24/2024 EXAM: CTA CHEST 02/24/2024 11:21:47 AM TECHNIQUE: CTA of the chest was performed after the administration of intravenous contrast. Multiplanar reformatted images are provided for review. MIP images are provided for review. Automated exposure control, iterative reconstruction, and/or weight based adjustment of the mA/kV was utilized to reduce the radiation dose to as low as reasonably achievable. COMPARISON: None available. CLINICAL HISTORY: Pulmonary embolism (PE) suspected, high prob. FINDINGS: PULMONARY ARTERIES: Pulmonary arteries are adequately opacified for evaluation. No acute pulmonary embolus. Main pulmonary artery is normal in caliber. MEDIASTINUM: The heart and pericardium demonstrate no acute abnormality. There is also mild-to-moderate calcific coronary artery disease. The thoracic aorta demonstrates moderate calcific atheromatous disease. LYMPH NODES: No mediastinal, hilar or axillary lymphadenopathy. LUNGS AND PLEURA: There does appear to be pulmonary venous thrombosis within the left lower lobe. The left lower lobe pulmonary vein is significantly more distended than the right. The right pulmonary vein is also opacified with contrast whereas the left lower lobe pulmonary vein appears to have an intraluminal filling defect, particularly on image 100 of sagittal series 8. There is also a similar finding on image 99 of series 4. There are ground-glass and reticular opacities present dependently within the lungs bilaterally. There are tiny bilateral pleural effusions lying dependently. No pneumothorax. UPPER ABDOMEN: Limited images of the upper  abdomen are unremarkable. SOFT TISSUES AND BONES: No acute bone or soft tissue abnormality. IMPRESSION: 1. No evidence of pulmonary embolism. 2. Probable pulmonary venous thrombosis within the left lower lobe pulmonary vein (distended vein with intraluminal filling defect). Um Recommend clinical correlation and management, including evaluation for underlying provoking factor, and consider short-interval follow-up chest CTA (or CT venography) to document resolution. Electronically signed by: Evalene Coho MD 02/24/2024 11:46 AM EST RP Workstation: [id number]   CT Cervical Spine Wo Contrast Result Date: 02/24/2024 EXAM: CT CERVICAL SPINE WITHOUT CONTRAST 02/24/2024 11:21:47 AM TECHNIQUE: CT of the cervical spine was performed without the administration of intravenous contrast. Multiplanar reformatted images are provided for review. Automated exposure control, iterative reconstruction, and/or weight based adjustment of the mA/kV was utilized to reduce the radiation dose to as low as reasonably achievable. COMPARISON: None available. CLINICAL HISTORY: Polytrauma, blunt. FINDINGS: BONES AND ALIGNMENT: Reversal of the normal cervical lordosis. Mild degenerative anterolisthesis at C2-C3, C3-C4, C4-C5, and C7-T1. No acute fracture or traumatic malalignment. DEGENERATIVE CHANGES: At C3-C4, there is moderate disc space narrowing and bilateral uncovertebral joint hypertrophy, resulting in moderate to severe bilateral neural foraminal stenosis. There is moderate diffuse cervical facet arthrosis. The right facets at C2-C3 are fused. The left facets at C4-C5 are also fused. SOFT TISSUES: No prevertebral soft tissue swelling. IMPRESSION: 1. No evidence of acute traumatic injury. 2. Reversal of the normal cervical lordosis with mild degenerative anterolisthesis at C2-C3, C3-C4, C4-5, and C7-T1. 3. Moderate disc space narrowing and bilateral uncovertebral joint hypertrophy at C3-4, resulting in moderate to severe bilateral  neural foraminal stenosis. 4. Moderate diffuse cervical facet arthrosis. Right facets at C2-3 and left facets at C4-5 are fused. Electronically signed by: Evalene Coho MD 02/24/2024 11:34 AM EST RP Workstation: [id number]   CT Head Wo Contrast Result Date: 02/24/2024 EXAM: CT HEAD WITHOUT CONTRAST 02/24/2024 11:21:47 AM TECHNIQUE:  CT of the head was performed without the administration of intravenous contrast. Automated exposure control, iterative reconstruction, and/or weight based adjustment of the mA/kV was utilized to reduce the radiation dose to as low as reasonably achievable. COMPARISON: 11/23/2023 CLINICAL HISTORY: Head trauma, moderate-severe. FINDINGS: BRAIN AND VENTRICLES: Age-related cerebral volume loss and periventricular white matter hypoattenuation consistent with chronic small vessel ischemic change. Mild calcific atheromatous disease within carotid siphons. No acute hemorrhage. No evidence of acute infarct. No hydrocephalus. No extra-axial collection. No mass effect or midline shift. ORBITS: Bilateral lens replacement. No acute abnormality. SINUSES: Frothy secretions within right sphenoid sinus. SOFT TISSUES AND SKULL: No acute soft tissue abnormality. No skull fracture. IMPRESSION: 1. No acute intracranial abnormality. 2. Age-related cerebral volume loss and periventricular white matter hypoattenuation consistent with chronic small vessel ischemic change. 3. Frothy secretions within the right sphenoid sinus, correlate for sinusitis. Electronically signed by: Evalene Coho MD 02/24/2024 11:30 AM EST RP Workstation: [id number]    Assessment and Plan   Brandy Orr is a pleasant 89 y.o. female with medical history significant for HLD, GERD, history of DVT in 2016 not on anticoagulation who was brought in from assisted living facility after a fall while she was taking a bath.  Patient reported that she tripped and fell in her bathroom and hit her head.  She reports that she  remembers the entire event.  She denied any loss of consciousness.  She denied having dizziness or lightheadedness prior to the fall.  After she fell she stated that she only had chest pain and some palpitations.   Upon arrival to the ED, patient is found to be in A-fib with RVR, and pulmonary thrombus, troponin 47, elevated BNP of 7824, head and neck CT scan within normal, CT angio of the chest showed pulmonary thrombosis.  EDP discussed with Dr. Marea from vascular surgery who recommended heparin  drip.    New A-fib with RVR/new pulmonary thrombus Right LE DVT -peroneal - Patient is on Amiodarone  drip. --[facility] cards following--IV digoxin , midodrine  added. Gave IV ns bolus -- She will be on heparin  drip. --seen by vascualr sx --pt hat fall risk. She does not ambulate much at her AL. She is WC bound due to bad knees  Chronic bilateral LE edema Chronic bilateral knee DJD--WC bound --elevated BNP --sats ok    Fall at home. - There is no obvious deep trauma or fractures. - CT head and neck showed no acute fractures - Continue supportive care - PT/OT   GERD/HLD - Resume home medications  Procedures: Family communication :none Consults :KC cards, vascular CODE STATUS: full DVT Prophylaxis :heparin  gtt Level of care: Progressive Status is: Inpatient Remains inpatient appropriate because: new onset afib, PE    TOTAL TIME TAKING CARE OF THIS PATIENT: 40 minutes.  >50% time spent on counselling and coordination of care  Note: This dictation was prepared with Dragon dictation along with smaller phrase technology. Any transcriptional errors that result from this process are unintentional.  Leita Blanch M.D    [facility]   CC: Primary care physician; Fernande Ophelia JINNY DOUGLAS, MD

## 2024-02-25 NOTE — Progress Notes (Signed)
 PHARMACY - ANTICOAGULATION CONSULT NOTE  Pharmacy Consult for heparin  infusion Indication: pulmonary embolus  Allergies[1]  Patient Measurements: Height: 5' 7 (170.2 cm) Weight: 61.2 kg (135 lb) IBW/kg (Calculated) : 61.6 HEPARIN  DW (KG): 61.2  Vital Signs: Temp: 97.5 F (36.4 C) (01/16 2222) Temp Source: Oral (01/16 1945) BP: 101/61 (01/16 2112) Pulse Rate: 75 (01/16 2258)  Labs: Recent Labs    02/24/24 0944 02/24/24 1209 02/24/24 2200  HGB 11.6*  --   --   HCT 35.4*  --   --   PLT 379  --   --   APTT  --  35  --   HEPARINUNFRC  --   --  0.52  CREATININE 1.24*  --   --     Estimated Creatinine Clearance: 29.7 mL/min (A) (by C-G formula based on SCr of 1.24 mg/dL (H)).   Medical History: Past Medical History:  Diagnosis Date   Arthritis    Cancer (HCC)    skin ca   Duodenal ulcer    Gastric ulcer     Medications:  Scheduled:  Infusions:   amiodarone  60 mg/hr (02/24/24 2219)   Followed by   amiodarone      heparin  1,000 Units/hr (02/24/24 1402)   PRN: acetaminophen  **OR** acetaminophen , fentaNYL  (SUBLIMAZE ) injection, ipratropium-albuterol , ondansetron  **OR** ondansetron  (ZOFRAN ) IV, polyethylene glycol  Assessment:  89 y.o. female PMH osteoarthritis, CKD, hyperlipidemia, prior right hip replacement (2015) presents for LOC. A review of medical records reveals no chronic anticoagulation prior to arrival. CT angio shows probable pulmonary venous thrombosis within the left lower lobe pulmonary vein.   Baseline Labs: aPTT 35s, Hgb 11.6, PLT wnl, INR pending  1/16 2200 HL: 0.52, therapeutic x 1  Goal of Therapy:  anti-Xa level 0.3-0.7 units/ml Monitor platelets by anticoagulation protocol: Yes   Plan:  --Heparin  level therapeutic x 1 --Continue heparin  infusion at 1000 units/hr --Check confirmatory anti-Xa level in 8 hours  --Continue to monitor H&H and platelets  Lum VEAR Mania, PharmD, BCPS 02/25/2024,12:33 AM       [1]  Allergies Allergen  Reactions   Oxycodone Nausea And Vomiting   Hydrocodone Nausea And Vomiting

## 2024-02-25 NOTE — TOC Initial Note (Signed)
 Transition of Care [facility]) - Initial/Assessment Note    Patient Details  Name: Brandy Orr MRN: [id number] Date of Birth: 06/23/1932  Transition of Care [facility]) CM/SW Contact:    Delphine KANDICE Bring, RN Phone Number: 02/25/2024, 3:10 PM  Clinical Narrative:                 Niece, Tina at bedside. Patient informed CM that she has been at [facility] for 3 weeks. Patient states that she was receiving therapy there. CM explained that [facility] may need to do an assessment to see if they can meet her needs when she is ready for discharge. Cm asked if they have a rehab/ SNF at [facility]. Niece states that they do have  memory care unit but she will ask.   Cm asked if she has been at a facility for rehab in the past, niece states that in 2015 when she had her hip surgery she went to [facility].   Patient has [facility] as her first choice but is amenable to other facilities.   Niece states that they arte trying to get her Afib under control  Expected Discharge Plan: Home w Home Health Services     Patient Goals and CMS Choice            Expected Discharge Plan and Services       Living arrangements for the past 2 months: Assisted Living Facility [facility])                                      Prior Living Arrangements/Services Living arrangements for the past 2 months: Assisted Living Facility [facility]) Lives with:: Self Patient language and need for interpreter reviewed:: No (English) Do you feel safe going back to the place where you live?: Yes               Activities of Daily Living   ADL Screening (condition at time of admission) Independently performs ADLs?: No Does the patient have a NEW difficulty with bathing/dressing/toileting/self-feeding that is expected to last >3 days?: Yes (Initiates electronic notice to provider for possible OT consult) Does the patient have a NEW difficulty with getting in/out of bed, walking, or climbing stairs that is  expected to last >3 days?: Yes (Initiates electronic notice to provider for possible PT consult) Does the patient have a NEW difficulty with communication that is expected to last >3 days?: Yes (Initiates electronic notice to provider for possible SLP consult) Is the patient deaf or have difficulty hearing?: Yes Does the patient have difficulty seeing, even when wearing glasses/contacts?: No Does the patient have difficulty concentrating, remembering, or making decisions?: No  Permission Sought/Granted                  Emotional Assessment Appearance:: Appears stated age   Affect (typically observed): Appropriate, Calm, Accepting Orientation: : Oriented to Self, Oriented to Place, Oriented to  Time, Oriented to Situation      Admission diagnosis:  Pulmonary thrombosis (HCC) [I26.99] A-fib (HCC) [I48.91] New onset a-fib (HCC) [I48.91] Atrial fibrillation with rapid ventricular response (HCC) [I48.91] Contusion of face, initial encounter [S00.83XA] Minor head injury, initial encounter [S09.90XA] Fall, initial encounter [W19.XXXA] Patient Active Problem List   Diagnosis Date Noted   A-fib [facility]) 02/24/2024   Fall at home, initial encounter 02/24/2024   Thrombus of pulmonary vein (HCC) 02/24/2024   Pure hypercholesterolemia 04/08/2022  CKD (chronic kidney disease) stage 3, GFR 30-59 ml/min (HCC) 04/08/2022   Generalized osteoarthritis 03/26/2021   GERD (gastroesophageal reflux disease) 03/26/2021   Aortic atherosclerosis 03/26/2021   PCP:  Fernande Ophelia JINNY DOUGLAS, MD Pharmacy:   [facility] [phone] - [place], [place] - [address] AT [facility] OF [address] [place] [address] [place] [address] Phone: [phone] Fax: [phone]     Social Drivers of Health (SDOH) Social History: SDOH Screenings   Food Insecurity: No Food Insecurity (02/25/2024)  Housing: Low Risk (02/25/2024)  Transportation Needs: No Transportation Needs (02/25/2024)  Utilities: Not At  Risk (02/25/2024)  Alcohol  Screen: Low Risk (07/16/2022)  Depression (PHQ2-9): High Risk (10/14/2022)  Financial Resource Strain: Low Risk  (12/28/2023)   Received from [facility]  Physical Activity: Inactive (07/16/2022)  Social Connections: Moderately Isolated (02/25/2024)  Stress: No Stress Concern Present (07/16/2022)  Tobacco Use: Low Risk (02/24/2024)   SDOH Interventions:     Readmission Risk Interventions     No data to display

## 2024-02-25 NOTE — Consult Note (Signed)
 "      VASCULAR SURGERY CONSULTATION   Requested by:  Leita Blanch, MD  Reason for consultation: pulmonary venous thrombosis    History of Present Illness   Brandy Orr is a 89 y.o. (1932/11/23) female who presents with cc: fall.  Reportedly pt tripped and fell.  Pt  was found to be new onset A-fib with RVR.  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  Pt notes her ambulation is limited as her LEFT leg doesn't always function as she wants.  She notes her legs are chronically swollen.  Past Medical History:  Diagnosis Date   Arthritis    Cancer (HCC)    skin ca   Duodenal ulcer    Gastric ulcer     Past Surgical History:  Procedure Laterality Date   ABDOMINAL HYSTERECTOMY     CATARACT EXTRACTION, BILATERAL Bilateral 2012   ESOPHAGOGASTRODUODENOSCOPY (EGD) WITH PROPOFOL  N/A 07/12/2014   Procedure: ESOPHAGOGASTRODUODENOSCOPY (EGD) WITH PROPOFOL ;  Surgeon: Lamar ONEIDA Holmes, MD;  Location: [facility] ENDOSCOPY;  Service: Endoscopy;  Laterality: N/A;   JOINT REPLACEMENT Right    THR   LAPAROSCOPIC BILATERAL SALPINGO OOPHERECTOMY Bilateral 01/10/2017   Procedure: LAPAROSCOPIC BILATERAL SALPINGO OOPHORECTOMY;  Surgeon: Verdon Keen, MD;  Location: [facility] ORS;  Service: Gynecology;  Laterality: Bilateral;   PERIPHERAL VASCULAR CATHETERIZATION N/A 07/24/2014   Procedure: IVC Filter Removal;  Surgeon: Cordella KANDICE Shawl, MD;  Location: [facility] INVASIVE CV LAB;  Service: Cardiovascular;  Laterality: N/A;     Social History   Socioeconomic History   Marital status: Widowed    Spouse name: Not on file   Number of children: 1   Years of education: Not on file   Highest education level: Not on file  Occupational History   Occupation: retired  Tobacco Use   Smoking status: Never   Smokeless tobacco: Never  Vaping Use   Vaping status: Never Used  Substance and Sexual Activity   Alcohol  use: No   Drug use: No   Sexual activity: Not on file  Other Topics Concern   Not on file  Social  History Narrative   Not on file   Social Drivers of Health   Tobacco Use: Low Risk (02/24/2024)   Patient History    Smoking Tobacco Use: Never    Smokeless Tobacco Use: Never    Passive Exposure: Not on file  Financial Resource Strain: Low Risk  (12/28/2023)   Received from [facility]   Overall Financial Resource Strain (CARDIA)    Difficulty of Paying Living Expenses: Not hard at all  Food Insecurity: No Food Insecurity (02/25/2024)   Epic    Worried About Radiation Protection Practitioner of Food in the Last Year: Never true    Ran Out of Food in the Last Year: Never true  Transportation Needs: No Transportation Needs (02/25/2024)   Epic    Lack of Transportation (Medical): No    Lack of Transportation (Non-Medical): No  Physical Activity: Inactive (07/16/2022)   Exercise Vital Sign    Days of Exercise per Week: 0 days    Minutes of Exercise per Session: 0 min  Stress: No Stress Concern Present (07/16/2022)   [employer] of Occupational Health - Occupational Stress Questionnaire    Feeling of Stress : Only a little  Social Connections: Moderately Isolated (02/25/2024)   Social Connection and Isolation Panel    Frequency of Communication with Friends and Family: Once a week    Frequency of Social Gatherings with Friends and Family: Once a  week    Attends Religious Services: 1 to 4 times per year    Active Member of Clubs or Organizations: No    Attends Banker Meetings: 1 to 4 times per year    Marital Status: Widowed  Intimate Partner Violence: Not At Risk (02/25/2024)   Epic    Fear of Current or Ex-Partner: No    Emotionally Abused: No    Physically Abused: No    Sexually Abused: No  Depression (PHQ2-9): High Risk (10/14/2022)   Depression (PHQ2-9)    PHQ-2 Score: 16  Alcohol  Screen: Low Risk (07/16/2022)   Alcohol  Screen    Last Alcohol  Screening Score (AUDIT): 0  Housing: Low Risk (02/25/2024)   Epic    Unable to Pay for Housing in the Last Year: No     Number of Times Moved in the Last Year: 1    Homeless in the Last Year: No  Utilities: Not At Risk (02/25/2024)   Epic    Threatened with loss of utilities: No  Health Literacy: Not on file    Family History  Problem Relation Age of Onset   Leukemia Mother    Diabetes Mother    Heart disease Father    Diabetes Father     Current Facility-Administered Medications  Medication Dose Route Frequency Provider Last Rate Last Admin   acetaminophen  (TYLENOL ) tablet 650 mg  650 mg Oral Q6H PRN Paudel, Keshab, MD   650 mg at 02/24/24 1426   Or   acetaminophen  (TYLENOL ) suppository 650 mg  650 mg Rectal Q6H PRN Roann Gouty, MD       amiodarone  (NEXTERONE  PREMIX) 360-4.14 MG/200ML-% (1.8 mg/mL) IV infusion  30 mg/hr Intravenous Continuous Callwood, Dwayne D, MD 16.67 mL/hr at 02/25/24 0145 30 mg/hr at 02/25/24 0145   fentaNYL  (SUBLIMAZE ) injection 12.5 mcg  12.5 mcg Intravenous Q2H PRN Roann Gouty, MD       heparin  ADULT infusion 100 units/mL (25000 units/250mL)  1,000 Units/hr Intravenous Continuous Nada Adriana BIRCH, RPH 10 mL/hr at 02/25/24 0906 1,000 Units/hr at 02/25/24 9093   ipratropium-albuterol  (DUONEB) 0.5-2.5 (3) MG/3ML nebulizer solution 3 mL  3 mL Nebulization Q6H PRN Roann Gouty, MD       ondansetron  (ZOFRAN ) tablet 4 mg  4 mg Oral Q6H PRN Paudel, Keshab, MD       Or   ondansetron  (ZOFRAN ) injection 4 mg  4 mg Intravenous Q6H PRN Paudel, Keshab, MD       polyethylene glycol (MIRALAX  / GLYCOLAX ) packet 17 g  17 g Oral Daily PRN Paudel, Gouty, MD        Allergies[1]  REVIEW OF SYSTEMS (negative unless checked):   Cardiac:  []  Chest pain or chest pressure? []  Shortness of breath upon activity? []  Shortness of breath when lying flat? [x]  Irregular heart rhythm?  Vascular:  []  Pain in calf, thigh, or hip brought on by walking? []  Pain in feet at night that wakes you up from your sleep? []  Blood clot in your veins? [x]  Leg swelling?  Pulmonary:  []  Oxygen at  home? []  Productive cough? []  Wheezing?  Neurologic:  []  Sudden weakness in arms or legs? []  Sudden numbness in arms or legs? []  Sudden onset of difficult speaking or slurred speech? []  Temporary loss of vision in one eye? []  Problems with dizziness?  Gastrointestinal:  []  Blood in stool? []  Vomited blood?  Genitourinary:  []  Burning when urinating? []  Blood in urine?  Psychiatric:  []  Major depression  Hematologic:  []   Bleeding problems? []  Problems with blood clotting?  Dermatologic:  []  Rashes or ulcers?  Constitutional:  []  Fever or chills?  Ear/Nose/Throat:  []  Change in hearing? []  Nose bleeds? []  Sore throat?  Musculoskeletal:  []  Back pain? []  Joint pain? []  Muscle pain?   Physical Examination     Vitals:   02/24/24 2258 02/25/24 0100 02/25/24 0800 02/25/24 0823  BP:   93/65   Pulse: 75  (!) 136   Resp: 16  20 20   Temp:   97.8 F (36.6 C)   TempSrc:   Oral   SpO2: 94%  95%   Weight:  68.4 kg    Height:  5' 7 (1.702 m)     Body mass index is 23.62 kg/m.  General Alert, O x 3, Cachectic, Elderly  Head [place]/AT,   Pulmonary Sym exp, distal BS, CTA B  Cardiac Irregularly, irregular rate and rhythm, no Murmurs, No rubs, No S3,S4  Vascular Palpable B DPA  Musculo- skeletal Moving arms as instructed, BLE: 3-4/5, BLE 1-2+ edema, no LDS  Neurologic Cranial nerves grossly intact, Pain and light touch intact in extremities, Motor exam as listed above  Psychiatric Judgement intact, Mood & affect appropriate for pt's clinical situation   Laboratory   CBC    Latest Ref Rng & Units 02/25/2024    4:31 AM 02/24/2024    9:44 AM 10/19/2022    3:52 PM  CBC  WBC 4.0 - 10.5 K/uL 6.3  7.0  6.9   Hemoglobin 12.0 - 15.0 g/dL 89.4  88.3  87.3   Hematocrit 36.0 - 46.0 % 31.3  35.4  37.9   Platelets 150 - 400 K/uL 315  379  309     BMP    Latest Ref Rng & Units 02/25/2024    4:31 AM 02/24/2024    9:44 AM 10/19/2022    3:52 PM  BMP  Glucose 70 - 99  mg/dL 894  893  95   BUN 8 - 23 mg/dL 39  45  23   Creatinine 0.44 - 1.00 mg/dL 8.94  8.75  9.02   BUN/Creat Ratio 6 - 22 (calc)   24   Sodium 135 - 145 mmol/L 136  136  141   Potassium 3.5 - 5.1 mmol/L 4.5  4.5  4.1   Chloride 98 - 111 mmol/L 106  102  107   CO2 22 - 32 mmol/L 21  22  26    Calcium 8.9 - 10.3 mg/dL 8.9  9.6  9.6     Coagulation Lab Results  Component Value Date   INR 1.1 02/25/2024   INR 1.0 03/11/2014   No results found for: PTT  Lipids    Component Value Date/Time   CHOL 228 (H) 10/19/2022 1552   TRIG 114 10/19/2022 1552   HDL 71 10/19/2022 1552   CHOLHDL 3.2 10/19/2022 1552   LDLCALC 135 (H) 10/19/2022 1552     Radiology     CT Angio Chest PE W and/or Wo Contrast Result Date: 02/24/2024 EXAM: CTA CHEST 02/24/2024 11:21:47 AM TECHNIQUE: CTA of the chest was performed after the administration of intravenous contrast. Multiplanar reformatted images are provided for review. MIP images are provided for review. Automated exposure control, iterative reconstruction, and/or weight based adjustment of the mA/kV was utilized to reduce the radiation dose to as low as reasonably achievable. COMPARISON: None available. CLINICAL HISTORY: Pulmonary embolism (PE) suspected, high prob. FINDINGS: PULMONARY ARTERIES: Pulmonary arteries are adequately opacified for evaluation. No acute  pulmonary embolus. Main pulmonary artery is normal in caliber. MEDIASTINUM: The heart and pericardium demonstrate no acute abnormality. There is also mild-to-moderate calcific coronary artery disease. The thoracic aorta demonstrates moderate calcific atheromatous disease. LYMPH NODES: No mediastinal, hilar or axillary lymphadenopathy. LUNGS AND PLEURA: There does appear to be pulmonary venous thrombosis within the left lower lobe. The left lower lobe pulmonary vein is significantly more distended than the right. The right pulmonary vein is also opacified with contrast whereas the left lower lobe  pulmonary vein appears to have an intraluminal filling defect, particularly on image 100 of sagittal series 8. There is also a similar finding on image 99 of series 4. There are ground-glass and reticular opacities present dependently within the lungs bilaterally. There are tiny bilateral pleural effusions lying dependently. No pneumothorax. UPPER ABDOMEN: Limited images of the upper abdomen are unremarkable. SOFT TISSUES AND BONES: No acute bone or soft tissue abnormality. IMPRESSION: 1. No evidence of pulmonary embolism. 2. Probable pulmonary venous thrombosis within the left lower lobe pulmonary vein (distended vein with intraluminal filling defect). Um Recommend clinical correlation and management, including evaluation for underlying provoking factor, and consider short-interval follow-up chest CTA (or CT venography) to document resolution. Electronically signed by: Evalene Coho MD 02/24/2024 11:46 AM EST RP Workstation: [id number]   CT Cervical Spine Wo Contrast Result Date: 02/24/2024 EXAM: CT CERVICAL SPINE WITHOUT CONTRAST 02/24/2024 11:21:47 AM TECHNIQUE: CT of the cervical spine was performed without the administration of intravenous contrast. Multiplanar reformatted images are provided for review. Automated exposure control, iterative reconstruction, and/or weight based adjustment of the mA/kV was utilized to reduce the radiation dose to as low as reasonably achievable. COMPARISON: None available. CLINICAL HISTORY: Polytrauma, blunt. FINDINGS: BONES AND ALIGNMENT: Reversal of the normal cervical lordosis. Mild degenerative anterolisthesis at C2-C3, C3-C4, C4-C5, and C7-T1. No acute fracture or traumatic malalignment. DEGENERATIVE CHANGES: At C3-C4, there is moderate disc space narrowing and bilateral uncovertebral joint hypertrophy, resulting in moderate to severe bilateral neural foraminal stenosis. There is moderate diffuse cervical facet arthrosis. The right facets at C2-C3 are fused. The left  facets at C4-C5 are also fused. SOFT TISSUES: No prevertebral soft tissue swelling. IMPRESSION: 1. No evidence of acute traumatic injury. 2. Reversal of the normal cervical lordosis with mild degenerative anterolisthesis at C2-C3, C3-C4, C4-5, and C7-T1. 3. Moderate disc space narrowing and bilateral uncovertebral joint hypertrophy at C3-4, resulting in moderate to severe bilateral neural foraminal stenosis. 4. Moderate diffuse cervical facet arthrosis. Right facets at C2-3 and left facets at C4-5 are fused. Electronically signed by: Evalene Coho MD 02/24/2024 11:34 AM EST RP Workstation: [id number]   CT Head Wo Contrast Result Date: 02/24/2024 EXAM: CT HEAD WITHOUT CONTRAST 02/24/2024 11:21:47 AM TECHNIQUE: CT of the head was performed without the administration of intravenous contrast. Automated exposure control, iterative reconstruction, and/or weight based adjustment of the mA/kV was utilized to reduce the radiation dose to as low as reasonably achievable. COMPARISON: 11/23/2023 CLINICAL HISTORY: Head trauma, moderate-severe. FINDINGS: BRAIN AND VENTRICLES: Age-related cerebral volume loss and periventricular white matter hypoattenuation consistent with chronic small vessel ischemic change. Mild calcific atheromatous disease within carotid siphons. No acute hemorrhage. No evidence of acute infarct. No hydrocephalus. No extra-axial collection. No mass effect or midline shift. ORBITS: Bilateral lens replacement. No acute abnormality. SINUSES: Frothy secretions within right sphenoid sinus. SOFT TISSUES AND SKULL: No acute soft tissue abnormality. No skull fracture. IMPRESSION: 1. No acute intracranial abnormality. 2. Age-related cerebral volume loss and periventricular white matter hypoattenuation consistent with chronic  small vessel ischemic change. 3. Frothy secretions within the right sphenoid sinus, correlate for sinusitis. Electronically signed by: Evalene Coho MD 02/24/2024 11:30 AM EST RP  Workstation: [id number]     Medical Decision Making   Brandy Orr is a 89 y.o. female who presents with: new onset a-fib, pulmonary VENOUS thrombosis, limited functional status  Pt has pulmonary VENOUS thrombosis.  There is no endovascular intervention for such routinely performed As recommended by Dr. Marea, continue anticoagulation which is also needed for her new onset afib Given patient's reported limited mobility, may need anticoagulation long-term wise also.   Placement of IVC filter NOT recommended as all filters are PRO-Thrombotic and require anticoagulation. Defer to anticoagulation mgmt to primary team and Cardiology Nothing more to add to care from a Vascular Surgical viewpoint  Redell Door, MD, FACS, FSVS Covering for [facility]: [phone]  02/25/2024, 9:21 AM       [1]  Allergies Allergen Reactions   Oxycodone Nausea And Vomiting   Hydrocodone Nausea And Vomiting   "

## 2024-02-25 NOTE — Plan of Care (Signed)

## 2024-02-25 NOTE — Progress Notes (Signed)
 PHARMACY - ANTICOAGULATION CONSULT NOTE  Pharmacy Consult for heparin  infusion Indication: pulmonary embolus  Allergies[1]  Patient Measurements: Height: 5' 7 (170.2 cm) Weight: 68.4 kg (150 lb 12.7 oz) IBW/kg (Calculated) : 61.6 HEPARIN  DW (KG): 68.4  Vital Signs: Temp: 97.5 F (36.4 C) (01/16 2222) Temp Source: Oral (01/16 1945) BP: 101/61 (01/16 2112) Pulse Rate: 75 (01/16 2258)  Labs: Recent Labs    02/24/24 0944 02/24/24 1209 02/24/24 2200 02/25/24 0431 02/25/24 0555  HGB 11.6*  --   --  10.5*  --   HCT 35.4*  --   --  31.3*  --   PLT 379  --   --  315  --   APTT  --  35  --   --   --   LABPROT  --   --   --  15.0  --   INR  --   --   --  1.1  --   HEPARINUNFRC  --   --  0.52  --  0.55  CREATININE 1.24*  --   --  1.05*  --     Estimated Creatinine Clearance: 35.3 mL/min (A) (by C-G formula based on SCr of 1.05 mg/dL (H)).   Medical History: Past Medical History:  Diagnosis Date   Arthritis    Cancer (HCC)    skin ca   Duodenal ulcer    Gastric ulcer     Medications:  Scheduled:  Infusions:   amiodarone  30 mg/hr (02/25/24 0145)   heparin  1,000 Units/hr (02/24/24 1402)   PRN: acetaminophen  **OR** acetaminophen , fentaNYL  (SUBLIMAZE ) injection, ipratropium-albuterol , ondansetron  **OR** ondansetron  (ZOFRAN ) IV, polyethylene glycol  Assessment:  89 y.o. female PMH osteoarthritis, CKD, hyperlipidemia, prior right hip replacement (2015) presents for LOC. A review of medical records reveals no chronic anticoagulation prior to arrival. CT angio shows probable pulmonary venous thrombosis within the left lower lobe pulmonary vein.   Baseline Labs: aPTT 35s, Hgb 11.6, PLT wnl, INR pending  1/16 2200 HL: 0.52, therapeutic x 1 1/17 0555 HL: 0.55, therapeutic x 2  Goal of Therapy:  anti-Xa level 0.3-0.7 units/ml Monitor platelets by anticoagulation protocol: Yes   Plan:  --Heparin  level therapeutic x 2 --Continue heparin  infusion at 1000  units/hr --Transition to daily monitoring of HL  --Continue to monitor H&H and platelets  Lum VEAR Mania, PharmD, BCPS 02/25/2024,6:41 AM        [1]  Allergies Allergen Reactions   Oxycodone Nausea And Vomiting   Hydrocodone Nausea And Vomiting

## 2024-02-25 NOTE — Progress Notes (Deleted)
" °   02/25/24 0800  Vitals  Temp 97.8 F (36.6 C)  Temp Source Oral  BP 93/65  MAP (mmHg) 74  BP Location Right Arm  BP Method Automatic  Patient Position (if appropriate) Lying  Pulse Rate (!) 136  ECG Heart Rate (!) 138  Resp 20  MEWS COLOR  MEWS Score Color Red  Oxygen Therapy  SpO2 95 %  MEWS Score  MEWS Temp 0  MEWS Systolic 1  MEWS Pulse 3  MEWS RR 0  MEWS LOC 0  MEWS Score 4   Notified Charge RN of Red MEWS and alerted the MD about her status. "

## 2024-02-26 ENCOUNTER — Inpatient Hospital Stay: Admit: 2024-02-26

## 2024-02-26 ENCOUNTER — Inpatient Hospital Stay

## 2024-02-26 DIAGNOSIS — S0083XA Contusion of other part of head, initial encounter: Secondary | ICD-10-CM | POA: Diagnosis not present

## 2024-02-26 DIAGNOSIS — K219 Gastro-esophageal reflux disease without esophagitis: Secondary | ICD-10-CM | POA: Diagnosis not present

## 2024-02-26 DIAGNOSIS — I4891 Unspecified atrial fibrillation: Secondary | ICD-10-CM

## 2024-02-26 DIAGNOSIS — Z515 Encounter for palliative care: Secondary | ICD-10-CM | POA: Diagnosis not present

## 2024-02-26 DIAGNOSIS — Z7189 Other specified counseling: Secondary | ICD-10-CM | POA: Diagnosis not present

## 2024-02-26 MED ORDER — ACETAMINOPHEN 325 MG PO TABS: 650.0000 mg | ORAL_TABLET | Freq: Four times a day (QID) | ORAL | Status: DC | PRN

## 2024-02-26 MED ORDER — POLYVINYL ALCOHOL 1.4 % OP SOLN: 1.0000 [drp] | Freq: Four times a day (QID) | OPHTHALMIC | Status: DC | PRN

## 2024-02-26 MED ORDER — HALOPERIDOL 5 MG PO TABS: 5.0000 mg | ORAL_TABLET | ORAL | Status: AC | PRN

## 2024-02-26 MED ORDER — ACETAMINOPHEN 650 MG RE SUPP: 650.0000 mg | Freq: Four times a day (QID) | RECTAL | Status: DC | PRN

## 2024-02-26 MED ORDER — HYOSCYAMINE SULFATE 0.125 MG SL SUBL: 0.1250 mg | SUBLINGUAL_TABLET | SUBLINGUAL | Status: AC | PRN

## 2024-02-26 MED ORDER — GLYCOPYRROLATE 1 MG PO TABS: 1.0000 mg | ORAL_TABLET | ORAL | Status: DC | PRN

## 2024-02-26 MED ORDER — GLYCOPYRROLATE 0.2 MG/ML IJ SOLN: 0.2000 mg | INTRAMUSCULAR | Status: DC | PRN

## 2024-02-26 MED ORDER — LORAZEPAM 0.5 MG PO TABS: 0.5000 mg | ORAL_TABLET | ORAL | Status: AC | PRN

## 2024-02-26 MED ORDER — MORPHINE SULFATE (CONCENTRATE) 20 MG/ML PO SOLN: 5.0000 mg | ORAL | Status: AC | PRN

## 2024-02-26 MED ORDER — SODIUM CHLORIDE 0.9 % IV SOLN: INTRAVENOUS | Status: DC

## 2024-02-26 MED ORDER — MORPHINE SULFATE (PF) 2 MG/ML IV SOLN: 1.0000 mg | INTRAVENOUS | Status: DC | PRN

## 2024-02-26 NOTE — TOC Transition Note (Addendum)
 Transition of Care [facility]) - Discharge Note   Patient Details  Name: Brandy Orr MRN: [id number] Date of Birth: Feb 12, 1932  [facility] CM/SW Contact:  Victory Jackquline RAMAN, RN Phone Number: 02/26/2024, 4:30 PM   Clinical Narrative:    RNCM received a secure chat from the MD and [facility] informing me that the patient is complete comfort care and family wants Hospice Home. Per Misty with [facility] the patient qualifies for Hospice Home. Patient discharging to Home Hospice. Nurse to call report to be called to: [phone]. Transportation set up by [facility]. Pt has discharge orders, no further concerns. RNCM signing off.   Final next level of care: Hospice Medical Facility Barriers to Discharge: Barriers Resolved   Patient Goals and CMS Choice            Discharge Placement                Patient to be transferred to facility by: [facility] Name of family member notified: [place] Patient and family notified of of transfer: 02/26/24  Discharge Plan and Services Additional resources added to the After Visit Summary for                                       Social Drivers of Health (SDOH) Interventions SDOH Screenings   Food Insecurity: No Food Insecurity (02/25/2024)  Housing: Low Risk (02/25/2024)  Transportation Needs: No Transportation Needs (02/25/2024)  Utilities: Not At Risk (02/25/2024)  Alcohol  Screen: Low Risk (07/16/2022)  Depression (PHQ2-9): High Risk (10/14/2022)  Financial Resource Strain: Low Risk  (12/28/2023)   Received from [facility]  Physical Activity: Inactive (07/16/2022)  Social Connections: Moderately Isolated (02/25/2024)  Stress: No Stress Concern Present (07/16/2022)  Tobacco Use: Low Risk (02/24/2024)     Readmission Risk Interventions     No data to display

## 2024-02-26 NOTE — Discharge Summary (Signed)
 " Physician Discharge Summary   Patient: Brandy Orr MRN: [id number] DOB: 05/29/1932  Admit date:     02/24/2024  Discharge date: 02/26/24  Discharge Physician: Cresencio Fairly   PCP: Fernande Ophelia JINNY DOUGLAS, MD   Recommendations at discharge:    Hospice Home  Discharge Diagnoses: Principal Problem:   New onset a-fib [facility]) Active Problems:   GERD (gastroesophageal reflux disease)   Pure hypercholesterolemia   Fall   Thrombus of pulmonary vein (HCC)   Contusion of face   Acute deep vein thrombosis (DVT) of right peroneal vein (HCC)   Pulmonary embolus (HCC)   Goals of care, counseling/discussion   Hospice care patient  Hospital Course: Assessment and Plan:  89 y.o. female with medical history significant for HLD, GERD, history of DVT in 2016 not on anticoagulation who was brought in from assisted living facility after a fall while she was taking a bath.  Patient reported that she tripped and fell in her bathroom and hit her head.  She reports that she remembers the entire event.  She denied any loss of consciousness.  She denied having dizziness or lightheadedness prior to the fall.  After she fell she stated that she only had chest pain and some palpitations. Upon arrival to the ED, patient is found to be in A-fib with RVR, and pulmonary thrombus, troponin 47, elevated BNP of 7824, head and neck CT scan within normal, CT angio of the chest showed pulmonary thrombosis.  EDP discussed with Dr. Marea from vascular surgery who recommended heparin  drip.     New A-fib with RVR/new pulmonary thrombus Right LE DVT -peroneal Chronic bilateral LE edema Chronic bilateral knee DJD--WC bound  Fall at home. GERD/HLD  Patient/family/POA decided to have comfort care per patient's wishes. She is going Hospice Home today after d/w Hospice.       Consultants: Cardio, Vascular Surgery  Disposition: Hospice care Diet recommendation:  Carb modified diet DISCHARGE MEDICATION: Allergies as of  02/26/2024       Reactions   Oxycodone Nausea And Vomiting   Hydrocodone Nausea And Vomiting        Medication List     STOP taking these medications    acetaminophen  650 MG CR tablet Commonly known as: TYLENOL    Acetaminophen  Extra Strength 500 MG Tabs   aspirin  EC 81 MG tablet   azelastine 0.05 % ophthalmic solution Commonly known as: OPTIVAR   cyanocobalamin  1000 MCG tablet Commonly known as: VITAMIN B12   D 1000 25 MCG (1000 UT) capsule Generic drug: Cholecalciferol   Glucosamine-Chondroitin 500-400 MG Caps   MiraLax  17 GM/SCOOP powder Generic drug: polyethylene glycol powder   traMADol  50 MG tablet Commonly known as: ULTRAM    traMADol -acetaminophen  37.5-325 MG tablet Commonly known as: ULTRACET        TAKE these medications    haloperidol  5 MG tablet Commonly known as: HALDOL  Take 1 tablet (5 mg total) by mouth every 4 (four) hours as needed for agitation. May crush, mix with water and give sublingually if needed.   hyoscyamine  0.125 MG SL tablet Commonly known as: LEVSIN  SL Place 1 tablet (0.125 mg total) under the tongue every 4 (four) hours as needed (excess oral secretions).   LORazepam  0.5 MG tablet Commonly known as: ATIVAN  Take 1 tablet (0.5 mg total) by mouth every 4 (four) hours as needed for anxiety. May crush, mix with water and give sublingually if needed.   morphine  20 MG/ML concentrated solution Commonly known as: ROXANOL Take 0.25 mLs (5  mg total) by mouth every 4 (four) hours as needed for severe pain (pain score 7-10). May give sublingually if needed.        Discharge Exam: Filed Weights   02/24/24 0941 02/25/24 0100  Weight: 61.2 kg 68.4 kg   GENERAL:  89 y.o.-year-old patient with no acute distress. patient is alert and awake. She would not let me touch/examine her.  Condition at discharge: poor  The results of significant diagnostics from this hospitalization (including imaging, microbiology, ancillary and laboratory)  are listed below for reference.   Imaging Studies: CT HEAD CODE STROKE WO CONTRAST` Result Date: 02/26/2024 EXAM: CT HEAD WITHOUT CONTRAST 02/26/2024 01:45:59 AM TECHNIQUE: CT of the head was performed without the administration of intravenous contrast. Automated exposure control, iterative reconstruction, and/or weight based adjustment of the mA/kV was utilized to reduce the radiation dose to as low as reasonably achievable. COMPARISON: 01/03/2025 CLINICAL HISTORY: Neuro deficit, acute, stroke suspected. FINDINGS: BRAIN AND VENTRICLES: No acute hemorrhage. No evidence of acute infarct. [place] Stroke Program Early CT Score (ASPECTS) is 10 (Ganglionic: 7/7, Supraganglionic: 3/3). Generalized volume loss and chronic ischemic white matter changes. No hydrocephalus. No extra-axial collection. No mass effect or midline shift. ORBITS: No acute abnormality. SINUSES: No acute abnormality. SOFT TISSUES AND SKULL: No acute soft tissue abnormality. No skull fracture. IMPRESSION: 1. No acute intracranial abnormality, ASPECTS 10. 2. Generalized volume loss and chronic ischemic white matter changes. Electronically signed by: Franky Stanford MD 02/26/2024 01:49 AM EST RP Workstation: [id number]   US  Venous Img Lower Bilateral (DVT) Result Date: 02/25/2024 CLINICAL DATA:  Pulmonary venous thrombosis EXAM: BILATERAL LOWER EXTREMITY VENOUS DOPPLER ULTRASOUND TECHNIQUE: Gray-scale sonography with graded compression, as well as color Doppler and duplex ultrasound were performed to evaluate the lower extremity deep venous systems from the level of the common femoral vein and including the common femoral, femoral, profunda femoral, popliteal and calf veins including the posterior tibial, peroneal and gastrocnemius veins when visible. The superficial great saphenous vein was also interrogated. Spectral Doppler was utilized to evaluate flow at rest and with distal augmentation maneuvers in the common femoral, femoral and popliteal  veins. COMPARISON:  None available FINDINGS: RIGHT LOWER EXTREMITY Common Femoral Vein: No evidence of thrombus. Normal compressibility, respiratory phasicity and response to augmentation. Saphenofemoral Junction: No evidence of thrombus. Normal compressibility and flow on color Doppler imaging. Profunda Femoral Vein: No evidence of thrombus. Normal compressibility and flow on color Doppler imaging. Femoral Vein: No evidence of thrombus. Normal compressibility, respiratory phasicity and response to augmentation. Popliteal Vein: No evidence of thrombus. Normal compressibility, respiratory phasicity and response to augmentation. Calf Veins: Intramural filling defect with decreased flow and compressibility of the peroneal vein is consistent with acute DVT. Posterior tibial vein is patent. Superficial Great Saphenous Vein: No evidence of thrombus. Normal compressibility. Other Findings:  None. LEFT LOWER EXTREMITY Common Femoral Vein: No evidence of thrombus. Normal compressibility, respiratory phasicity and response to augmentation. Saphenofemoral Junction: No evidence of thrombus. Normal compressibility and flow on color Doppler imaging. Profunda Femoral Vein: No evidence of thrombus. Normal compressibility and flow on color Doppler imaging. Femoral Vein: No evidence of thrombus. Normal compressibility, respiratory phasicity and response to augmentation. Popliteal Vein: No evidence of thrombus. Normal compressibility, respiratory phasicity and response to augmentation. Calf Veins: No evidence of thrombus. Normal compressibility and flow on color Doppler imaging. Superficial Great Saphenous Vein: No evidence of thrombus. Normal compressibility. Other Findings:  None. IMPRESSION: 1. Occlusive deep venous thrombosis of the RIGHT peroneal vein. 2. No  DVT of the LEFT lower extremity. These results will be called to the ordering clinician or representative by the Radiologist Assistant, and communication documented in the  PACS or [employer]. Electronically Signed   By: Aliene Lloyd M.D.   On: 02/25/2024 13:11   CT Angio Chest PE W and/or Wo Contrast Result Date: 02/24/2024 EXAM: CTA CHEST 02/24/2024 11:21:47 AM TECHNIQUE: CTA of the chest was performed after the administration of intravenous contrast. Multiplanar reformatted images are provided for review. MIP images are provided for review. Automated exposure control, iterative reconstruction, and/or weight based adjustment of the mA/kV was utilized to reduce the radiation dose to as low as reasonably achievable. COMPARISON: None available. CLINICAL HISTORY: Pulmonary embolism (PE) suspected, high prob. FINDINGS: PULMONARY ARTERIES: Pulmonary arteries are adequately opacified for evaluation. No acute pulmonary embolus. Main pulmonary artery is normal in caliber. MEDIASTINUM: The heart and pericardium demonstrate no acute abnormality. There is also mild-to-moderate calcific coronary artery disease. The thoracic aorta demonstrates moderate calcific atheromatous disease. LYMPH NODES: No mediastinal, hilar or axillary lymphadenopathy. LUNGS AND PLEURA: There does appear to be pulmonary venous thrombosis within the left lower lobe. The left lower lobe pulmonary vein is significantly more distended than the right. The right pulmonary vein is also opacified with contrast whereas the left lower lobe pulmonary vein appears to have an intraluminal filling defect, particularly on image 100 of sagittal series 8. There is also a similar finding on image 99 of series 4. There are ground-glass and reticular opacities present dependently within the lungs bilaterally. There are tiny bilateral pleural effusions lying dependently. No pneumothorax. UPPER ABDOMEN: Limited images of the upper abdomen are unremarkable. SOFT TISSUES AND BONES: No acute bone or soft tissue abnormality. IMPRESSION: 1. No evidence of pulmonary embolism. 2. Probable pulmonary venous thrombosis within the left lower  lobe pulmonary vein (distended vein with intraluminal filling defect). Um Recommend clinical correlation and management, including evaluation for underlying provoking factor, and consider short-interval follow-up chest CTA (or CT venography) to document resolution. Electronically signed by: Evalene Coho MD 02/24/2024 11:46 AM EST RP Workstation: [id number]   CT Cervical Spine Wo Contrast Result Date: 02/24/2024 EXAM: CT CERVICAL SPINE WITHOUT CONTRAST 02/24/2024 11:21:47 AM TECHNIQUE: CT of the cervical spine was performed without the administration of intravenous contrast. Multiplanar reformatted images are provided for review. Automated exposure control, iterative reconstruction, and/or weight based adjustment of the mA/kV was utilized to reduce the radiation dose to as low as reasonably achievable. COMPARISON: None available. CLINICAL HISTORY: Polytrauma, blunt. FINDINGS: BONES AND ALIGNMENT: Reversal of the normal cervical lordosis. Mild degenerative anterolisthesis at C2-C3, C3-C4, C4-C5, and C7-T1. No acute fracture or traumatic malalignment. DEGENERATIVE CHANGES: At C3-C4, there is moderate disc space narrowing and bilateral uncovertebral joint hypertrophy, resulting in moderate to severe bilateral neural foraminal stenosis. There is moderate diffuse cervical facet arthrosis. The right facets at C2-C3 are fused. The left facets at C4-C5 are also fused. SOFT TISSUES: No prevertebral soft tissue swelling. IMPRESSION: 1. No evidence of acute traumatic injury. 2. Reversal of the normal cervical lordosis with mild degenerative anterolisthesis at C2-C3, C3-C4, C4-5, and C7-T1. 3. Moderate disc space narrowing and bilateral uncovertebral joint hypertrophy at C3-4, resulting in moderate to severe bilateral neural foraminal stenosis. 4. Moderate diffuse cervical facet arthrosis. Right facets at C2-3 and left facets at C4-5 are fused. Electronically signed by: Evalene Coho MD 02/24/2024 11:34 AM EST RP  Workstation: [id number]   CT Head Wo Contrast Result Date: 02/24/2024 EXAM: CT HEAD WITHOUT CONTRAST 02/24/2024  11:21:47 AM TECHNIQUE: CT of the head was performed without the administration of intravenous contrast. Automated exposure control, iterative reconstruction, and/or weight based adjustment of the mA/kV was utilized to reduce the radiation dose to as low as reasonably achievable. COMPARISON: 11/23/2023 CLINICAL HISTORY: Head trauma, moderate-severe. FINDINGS: BRAIN AND VENTRICLES: Age-related cerebral volume loss and periventricular white matter hypoattenuation consistent with chronic small vessel ischemic change. Mild calcific atheromatous disease within carotid siphons. No acute hemorrhage. No evidence of acute infarct. No hydrocephalus. No extra-axial collection. No mass effect or midline shift. ORBITS: Bilateral lens replacement. No acute abnormality. SINUSES: Frothy secretions within right sphenoid sinus. SOFT TISSUES AND SKULL: No acute soft tissue abnormality. No skull fracture. IMPRESSION: 1. No acute intracranial abnormality. 2. Age-related cerebral volume loss and periventricular white matter hypoattenuation consistent with chronic small vessel ischemic change. 3. Frothy secretions within the right sphenoid sinus, correlate for sinusitis. Electronically signed by: Evalene Coho MD 02/24/2024 11:30 AM EST RP Workstation: [id number]    Microbiology: No results found for this or any previous visit.  Labs: CBC: Recent Labs  Lab 02/24/24 0944 02/25/24 0431  WBC 7.0 6.3  HGB 11.6* 10.5*  HCT 35.4* 31.3*  MCV 106.3* 104.7*  PLT 379 315   Basic Metabolic Panel: Recent Labs  Lab 02/24/24 0944 02/25/24 0431  NA 136 136  K 4.5 4.5  CL 102 106  CO2 22 21*  GLUCOSE 106* 105*  BUN 45* 39*  CREATININE 1.24* 1.05*  CALCIUM 9.6 8.9   Liver Function Tests: Recent Labs  Lab 02/25/24 0431  AST 67*  ALT 92*  ALKPHOS 116  BILITOT 0.3  PROT 5.6*  ALBUMIN 3.3*   CBG: No  results for input(s): GLUCAP in the last 168 hours.  Discharge time spent: greater than 30 minutes.  Signed: Cresencio Fairly, MD [facility] 02/26/2024 "

## 2024-02-26 NOTE — Progress Notes (Signed)
 OT Cancellation Note  Patient Details Name: Brandy Orr MRN: [id number] DOB: 31-Jan-1933   Cancelled Treatment:    Reason Eval/Treat Not Completed: Other (comment). Per MD, pt comfort care now. Will complete OT orders.   Daymion Nazaire R., MPH, MS, OTR/L ascom [phone] 02/26/24, 10:48 AM

## 2024-02-26 NOTE — IPAL (Signed)
" °  Interdisciplinary Goals of Care Family Meeting   Date carried out: 02/26/2024  Location of the meeting: Bedside  Member's involved: Family Member or next of kin  Durable Power of Attorney or environmental health practitioner: Niece and brother at bedside/POA  Discussion: We discussed goals of care for [employer]   Code status:   Code Status: Do not attempt resuscitation (DNR) - Comfort care   Disposition: Home with Hospice versus hospice home  Time spent for the meeting: 35 minutes    Cresencio Fairly, MD  02/26/2024, 1:39 PM   "

## 2024-02-26 NOTE — Plan of Care (Signed)
 VSS. RA. Patient placed on comfort care this AM. Family at bedside. Plan to transfer to hospice house. Report given to facility.  Problem: Education: Goal: Knowledge of General Education information will improve Description: Including pain rating scale, medication(s)/side effects and non-pharmacologic comfort measures Outcome: Progressing   Problem: Health Behavior/Discharge Planning: Goal: Ability to manage health-related needs will improve Outcome: Progressing   Problem: Clinical Measurements: Goal: Ability to maintain clinical measurements within normal limits will improve Outcome: Progressing Goal: Will remain free from infection Outcome: Progressing Goal: Diagnostic test results will improve Outcome: Progressing Goal: Respiratory complications will improve Outcome: Progressing Goal: Cardiovascular complication will be avoided Outcome: Progressing   Problem: Activity: Goal: Risk for activity intolerance will decrease Outcome: Progressing   Problem: Nutrition: Goal: Adequate nutrition will be maintained Outcome: Progressing   Problem: Coping: Goal: Level of anxiety will decrease Outcome: Progressing   Problem: Elimination: Goal: Will not experience complications related to bowel motility Outcome: Progressing Goal: Will not experience complications related to urinary retention Outcome: Progressing   Problem: Pain Managment: Goal: General experience of comfort will improve and/or be controlled Outcome: Progressing   Problem: Safety: Goal: Ability to remain free from injury will improve Outcome: Progressing   Problem: Skin Integrity: Goal: Risk for impaired skin integrity will decrease Outcome: Progressing

## 2024-02-26 NOTE — Progress Notes (Signed)
" °  ° °      CROSS COVER NOTE  NAME: Brandy Orr MRN: [id number] DOB : 07/12/1932 ATTENDING PHYSICIAN: Tobie Calix, MD    Date of Service   02/26/2024   HPI/Events of Note   Subjective: Message received from RN notifying me of a neuro change with patient.  RN reports at beginning of shift patient was alert, oriented, and responding verbally.  At 2200 patient was noted to no longer be responding verbally and during midnight rounds patient still not responding verbally.  Review of Systems  Unable to perform ROS: Mental status change    Objective:  Vitals:   02/25/24 0100 02/25/24 0800 02/25/24 0823 02/25/24 1005  BP:  93/65  134/73  Pulse:  (!) 136    Resp:  20 20   Temp:  97.8 F (36.6 C)    TempSrc:  Oral    SpO2:  95%    Weight: 68.4 kg     Height: 5' 7 (1.702 m)       Physical Exam Eyes:     Pupils: Pupils are equal, round, and reactive to light.  Cardiovascular:     Rate and Rhythm: Rhythm irregular.  Pulmonary:     Effort: Pulmonary effort is normal.     Breath sounds: Normal breath sounds.  Neurological:     Mental Status: She is alert.     Sensory: Sensation is intact.     Motor: Weakness present.     Comments: (+) Expressive aphasia, follows commands, and nods head appropriately.  No other focal deficits noted       Interventions   Assessment/Plan: With patient's new onset of A-fib, will get a head CT  Continue Eliquis Digoxin  level already ordered for a.m. Delirium precautions        To reach the provider On-Call:   7AM- 7PM see care teams to locate the attending and reach out to them via [email or web address] Password: [email or web address] 7PM-7AM contact night-coverage If you still have difficulty reaching the appropriate provider, please page the [facility] (Director on Call) for [facility] on amion for assistance  This document was prepared using Conservation officer, historic buildings and may include unintentional dictation errors.  Rockie Rams  FNP-BC,  PMHNP-BC Nurse Practitioner [facility] [place]  "

## 2024-02-26 NOTE — Progress Notes (Signed)
 At the start of the shift patient was alert, complaining of how she had a bad day today, stated that they had been giving her all these new medications today and that her stomach was hurting and she felt like she was going to vomit. She refused Zofran , and stated that she wanted to get some rest. 2200 I went to give patient her medications and she will not talk to me. she used head gestures appropriately but will not speak. Charge Nurse Brittany called came into the room and she only will use head gestures to answer brittany questions. I went into patient room at mid night rounding Patient continues to use head gestures she will not speak. Covering NP [place] notified.

## 2024-02-26 NOTE — Progress Notes (Signed)
 Patient refusing to be assessed this morning. Patient educated on my role as a nurse to make sure her vitals/assessment are within normal limits to ensure her safety. Patient accepted the risks and did not want me to assess her at that moment. This RN will reassess patient when she is willing.

## 2024-02-26 NOTE — Progress Notes (Signed)
 [facility] Cardiology    SUBJECTIVE: Patient complained of palpitation tachycardia some shortness of breath recently had a fall unclear if does not believe she passed out CTA mechanical fall with head injury no chest pain no here for temporal   Vitals:   02/25/24 0823 02/25/24 1005 02/25/24 2100 02/25/24 2300  BP:  134/73    Pulse:      Resp: 20     Temp:   98.3 F (36.8 C) 98.3 F (36.8 C)  TempSrc:      SpO2:      Weight:      Height:        No intake or output data in the 24 hours ending 02/26/24 0834    PHYSICAL EXAM  General: Well developed, well nourished, in no acute distress HEENT:  Normocephalic and atramatic Neck:  No JVD.  Lungs: Clear bilaterally to auscultation and percussion. Heart: HRRR . Normal S1 and S2 without gallops or murmurs.  Abdomen: Bowel sounds are positive, abdomen soft and non-tender  Msk:  Back normal, normal gait. Normal strength and tone for age. Extremities: No clubbing, cyanosis or edema.   Neuro: Alert and oriented X 3. Psych:  Good affect, responds appropriately   LABS: Basic Metabolic Panel: Recent Labs    02/24/24 0944 02/25/24 0431  NA 136 136  K 4.5 4.5  CL 102 106  CO2 22 21*  GLUCOSE 106* 105*  BUN 45* 39*  CREATININE 1.24* 1.05*  CALCIUM 9.6 8.9   Liver Function Tests: Recent Labs    02/25/24 0431  AST 67*  ALT 92*  ALKPHOS 116  BILITOT 0.3  PROT 5.6*  ALBUMIN 3.3*   No results for input(s): LIPASE, AMYLASE in the last 72 hours. CBC: Recent Labs    02/24/24 0944 02/25/24 0431  WBC 7.0 6.3  HGB 11.6* 10.5*  HCT 35.4* 31.3*  MCV 106.3* 104.7*  PLT 379 315   Cardiac Enzymes: No results for input(s): CKTOTAL, CKMB, CKMBINDEX, TROPONINI in the last 72 hours. BNP: Invalid input(s): POCBNP D-Dimer: No results for input(s): DDIMER in the last 72 hours. Hemoglobin A1C: No results for input(s): HGBA1C in the last 72 hours. Fasting Lipid Panel: No results for input(s): CHOL, HDL,  LDLCALC, TRIG, CHOLHDL, LDLDIRECT in the last 72 hours. Thyroid Function Tests: Recent Labs    02/24/24 0944  TSH 1.590   Anemia Panel: No results for input(s): VITAMINB12, FOLATE, FERRITIN, TIBC, IRON, RETICCTPCT in the last 72 hours.  CT HEAD CODE STROKE WO CONTRAST` Result Date: 02/26/2024 EXAM: CT HEAD WITHOUT CONTRAST 02/26/2024 01:45:59 AM TECHNIQUE: CT of the head was performed without the administration of intravenous contrast. Automated exposure control, iterative reconstruction, and/or weight based adjustment of the mA/kV was utilized to reduce the radiation dose to as low as reasonably achievable. COMPARISON: 01/03/2025 CLINICAL HISTORY: Neuro deficit, acute, stroke suspected. FINDINGS: BRAIN AND VENTRICLES: No acute hemorrhage. No evidence of acute infarct. [place] Stroke Program Early CT Score (ASPECTS) is 10 (Ganglionic: 7/7, Supraganglionic: 3/3). Generalized volume loss and chronic ischemic white matter changes. No hydrocephalus. No extra-axial collection. No mass effect or midline shift. ORBITS: No acute abnormality. SINUSES: No acute abnormality. SOFT TISSUES AND SKULL: No acute soft tissue abnormality. No skull fracture. IMPRESSION: 1. No acute intracranial abnormality, ASPECTS 10. 2. Generalized volume loss and chronic ischemic white matter changes. Electronically signed by: Franky Stanford MD 02/26/2024 01:49 AM EST RP Workstation: [id number]   US  Venous Img Lower Bilateral (DVT) Result Date: 02/25/2024 CLINICAL DATA:  Pulmonary venous  thrombosis EXAM: BILATERAL LOWER EXTREMITY VENOUS DOPPLER ULTRASOUND TECHNIQUE: Gray-scale sonography with graded compression, as well as color Doppler and duplex ultrasound were performed to evaluate the lower extremity deep venous systems from the level of the common femoral vein and including the common femoral, femoral, profunda femoral, popliteal and calf veins including the posterior tibial, peroneal and gastrocnemius veins  when visible. The superficial great saphenous vein was also interrogated. Spectral Doppler was utilized to evaluate flow at rest and with distal augmentation maneuvers in the common femoral, femoral and popliteal veins. COMPARISON:  None available FINDINGS: RIGHT LOWER EXTREMITY Common Femoral Vein: No evidence of thrombus. Normal compressibility, respiratory phasicity and response to augmentation. Saphenofemoral Junction: No evidence of thrombus. Normal compressibility and flow on color Doppler imaging. Profunda Femoral Vein: No evidence of thrombus. Normal compressibility and flow on color Doppler imaging. Femoral Vein: No evidence of thrombus. Normal compressibility, respiratory phasicity and response to augmentation. Popliteal Vein: No evidence of thrombus. Normal compressibility, respiratory phasicity and response to augmentation. Calf Veins: Intramural filling defect with decreased flow and compressibility of the peroneal vein is consistent with acute DVT. Posterior tibial vein is patent. Superficial Great Saphenous Vein: No evidence of thrombus. Normal compressibility. Other Findings:  None. LEFT LOWER EXTREMITY Common Femoral Vein: No evidence of thrombus. Normal compressibility, respiratory phasicity and response to augmentation. Saphenofemoral Junction: No evidence of thrombus. Normal compressibility and flow on color Doppler imaging. Profunda Femoral Vein: No evidence of thrombus. Normal compressibility and flow on color Doppler imaging. Femoral Vein: No evidence of thrombus. Normal compressibility, respiratory phasicity and response to augmentation. Popliteal Vein: No evidence of thrombus. Normal compressibility, respiratory phasicity and response to augmentation. Calf Veins: No evidence of thrombus. Normal compressibility and flow on color Doppler imaging. Superficial Great Saphenous Vein: No evidence of thrombus. Normal compressibility. Other Findings:  None. IMPRESSION: 1. Occlusive deep venous  thrombosis of the RIGHT peroneal vein. 2. No DVT of the LEFT lower extremity. These results will be called to the ordering clinician or representative by the Radiologist Assistant, and communication documented in the PACS or [employer]. Electronically Signed   By: Aliene Lloyd M.D.   On: 02/25/2024 13:11   CT Angio Chest PE W and/or Wo Contrast Result Date: 02/24/2024 EXAM: CTA CHEST 02/24/2024 11:21:47 AM TECHNIQUE: CTA of the chest was performed after the administration of intravenous contrast. Multiplanar reformatted images are provided for review. MIP images are provided for review. Automated exposure control, iterative reconstruction, and/or weight based adjustment of the mA/kV was utilized to reduce the radiation dose to as low as reasonably achievable. COMPARISON: None available. CLINICAL HISTORY: Pulmonary embolism (PE) suspected, high prob. FINDINGS: PULMONARY ARTERIES: Pulmonary arteries are adequately opacified for evaluation. No acute pulmonary embolus. Main pulmonary artery is normal in caliber. MEDIASTINUM: The heart and pericardium demonstrate no acute abnormality. There is also mild-to-moderate calcific coronary artery disease. The thoracic aorta demonstrates moderate calcific atheromatous disease. LYMPH NODES: No mediastinal, hilar or axillary lymphadenopathy. LUNGS AND PLEURA: There does appear to be pulmonary venous thrombosis within the left lower lobe. The left lower lobe pulmonary vein is significantly more distended than the right. The right pulmonary vein is also opacified with contrast whereas the left lower lobe pulmonary vein appears to have an intraluminal filling defect, particularly on image 100 of sagittal series 8. There is also a similar finding on image 99 of series 4. There are ground-glass and reticular opacities present dependently within the lungs bilaterally. There are tiny bilateral pleural effusions lying dependently. No  pneumothorax. UPPER ABDOMEN: Limited images of  the upper abdomen are unremarkable. SOFT TISSUES AND BONES: No acute bone or soft tissue abnormality. IMPRESSION: 1. No evidence of pulmonary embolism. 2. Probable pulmonary venous thrombosis within the left lower lobe pulmonary vein (distended vein with intraluminal filling defect). Um Recommend clinical correlation and management, including evaluation for underlying provoking factor, and consider short-interval follow-up chest CTA (or CT venography) to document resolution. Electronically signed by: Evalene Coho MD 02/24/2024 11:46 AM EST RP Workstation: [id number]   CT Cervical Spine Wo Contrast Result Date: 02/24/2024 EXAM: CT CERVICAL SPINE WITHOUT CONTRAST 02/24/2024 11:21:47 AM TECHNIQUE: CT of the cervical spine was performed without the administration of intravenous contrast. Multiplanar reformatted images are provided for review. Automated exposure control, iterative reconstruction, and/or weight based adjustment of the mA/kV was utilized to reduce the radiation dose to as low as reasonably achievable. COMPARISON: None available. CLINICAL HISTORY: Polytrauma, blunt. FINDINGS: BONES AND ALIGNMENT: Reversal of the normal cervical lordosis. Mild degenerative anterolisthesis at C2-C3, C3-C4, C4-C5, and C7-T1. No acute fracture or traumatic malalignment. DEGENERATIVE CHANGES: At C3-C4, there is moderate disc space narrowing and bilateral uncovertebral joint hypertrophy, resulting in moderate to severe bilateral neural foraminal stenosis. There is moderate diffuse cervical facet arthrosis. The right facets at C2-C3 are fused. The left facets at C4-C5 are also fused. SOFT TISSUES: No prevertebral soft tissue swelling. IMPRESSION: 1. No evidence of acute traumatic injury. 2. Reversal of the normal cervical lordosis with mild degenerative anterolisthesis at C2-C3, C3-C4, C4-5, and C7-T1. 3. Moderate disc space narrowing and bilateral uncovertebral joint hypertrophy at C3-4, resulting in moderate to severe  bilateral neural foraminal stenosis. 4. Moderate diffuse cervical facet arthrosis. Right facets at C2-3 and left facets at C4-5 are fused. Electronically signed by: Evalene Coho MD 02/24/2024 11:34 AM EST RP Workstation: [id number]   CT Head Wo Contrast Result Date: 02/24/2024 EXAM: CT HEAD WITHOUT CONTRAST 02/24/2024 11:21:47 AM TECHNIQUE: CT of the head was performed without the administration of intravenous contrast. Automated exposure control, iterative reconstruction, and/or weight based adjustment of the mA/kV was utilized to reduce the radiation dose to as low as reasonably achievable. COMPARISON: 11/23/2023 CLINICAL HISTORY: Head trauma, moderate-severe. FINDINGS: BRAIN AND VENTRICLES: Age-related cerebral volume loss and periventricular white matter hypoattenuation consistent with chronic small vessel ischemic change. Mild calcific atheromatous disease within carotid siphons. No acute hemorrhage. No evidence of acute infarct. No hydrocephalus. No extra-axial collection. No mass effect or midline shift. ORBITS: Bilateral lens replacement. No acute abnormality. SINUSES: Frothy secretions within right sphenoid sinus. SOFT TISSUES AND SKULL: No acute soft tissue abnormality. No skull fracture. IMPRESSION: 1. No acute intracranial abnormality. 2. Age-related cerebral volume loss and periventricular white matter hypoattenuation consistent with chronic small vessel ischemic change. 3. Frothy secretions within the right sphenoid sinus, correlate for sinusitis. Electronically signed by: Evalene Coho MD 02/24/2024 11:30 AM EST RP Workstation: [id number]     Echo pending  TELEMETRY: Atrial fibrillation rate of 95:  ASSESSMENT AND PLAN:  Principal Problem:   New onset a-fib (HCC) Active Problems:   GERD (gastroesophageal reflux disease)   Pure hypercholesterolemia   Fall   Thrombus of pulmonary vein (HCC)   Contusion of face   Acute deep vein thrombosis (DVT) of right peroneal vein (HCC)    Pulmonary embolus (HCC)    Plan Atrial fibrillation continue rate control amiodarone  Eliquis consider follow-up with EP GERD recommend PPI therapy for reflux testing DVT venous thrombosis anticoagulation as needed Mechanical fall continue conservative management History of thrombus  on CT of the chest recommend anticoagulation therapy    Cara JONETTA Lovelace, MD,  02/26/2024 8:34 AM
